# Patient Record
Sex: Female | Born: 1975 | Race: Black or African American | Hispanic: No | Marital: Single | Smoking: Former smoker
Health system: Southern US, Community
[De-identification: ages and names within clinical notes are randomized; demographics above are authoritative.]

## PROBLEM LIST (undated history)

## (undated) ENCOUNTER — Emergency Department (HOSPITAL_COMMUNITY): Admission: EM | Payer: Medicaid Other | Source: Home / Self Care

## (undated) DIAGNOSIS — I10 Essential (primary) hypertension: Secondary | ICD-10-CM

---

## 2009-10-08 HISTORY — PX: ENDOMETRIAL ABLATION: SHX621

## 2019-08-13 ENCOUNTER — Other Ambulatory Visit: Payer: Self-pay | Admitting: Nurse Practitioner

## 2019-08-13 DIAGNOSIS — N6320 Unspecified lump in the left breast, unspecified quadrant: Secondary | ICD-10-CM

## 2019-08-13 DIAGNOSIS — N6310 Unspecified lump in the right breast, unspecified quadrant: Secondary | ICD-10-CM

## 2019-08-19 ENCOUNTER — Other Ambulatory Visit: Payer: Self-pay | Admitting: Nurse Practitioner

## 2019-08-20 ENCOUNTER — Other Ambulatory Visit: Payer: Self-pay

## 2019-09-09 ENCOUNTER — Ambulatory Visit
Admission: RE | Admit: 2019-09-09 | Discharge: 2019-09-09 | Disposition: A | Discharge status: Home / Self Care | Payer: Medicaid Other | Source: Ambulatory Visit | Attending: Nurse Practitioner | Admitting: Nurse Practitioner

## 2019-09-09 ENCOUNTER — Other Ambulatory Visit: Payer: Medicaid Other

## 2019-09-09 ENCOUNTER — Other Ambulatory Visit: Payer: Self-pay

## 2019-09-09 ENCOUNTER — Other Ambulatory Visit: Payer: Self-pay | Admitting: Nurse Practitioner

## 2019-09-09 DIAGNOSIS — N6310 Unspecified lump in the right breast, unspecified quadrant: Secondary | ICD-10-CM

## 2019-09-09 DIAGNOSIS — N6320 Unspecified lump in the left breast, unspecified quadrant: Secondary | ICD-10-CM

## 2019-09-09 IMAGING — US US BREAST*R* LIMITED INC AXILLA
1 series · 1 of 1 positions shown · non-contrast
Comparison: Initial mammogram earlier same day

CLINICAL DATA: Patient returns for ultrasound evaluation of the
palpable area of concern within the right breast 5 o'clock position.

EXAM:
ULTRASOUND OF THE RIGHT BREAST

[Series 1: us breast*right* limited inc axilla · 0.07mm/px · 1 of 1 slices shown]
[im 1/1]
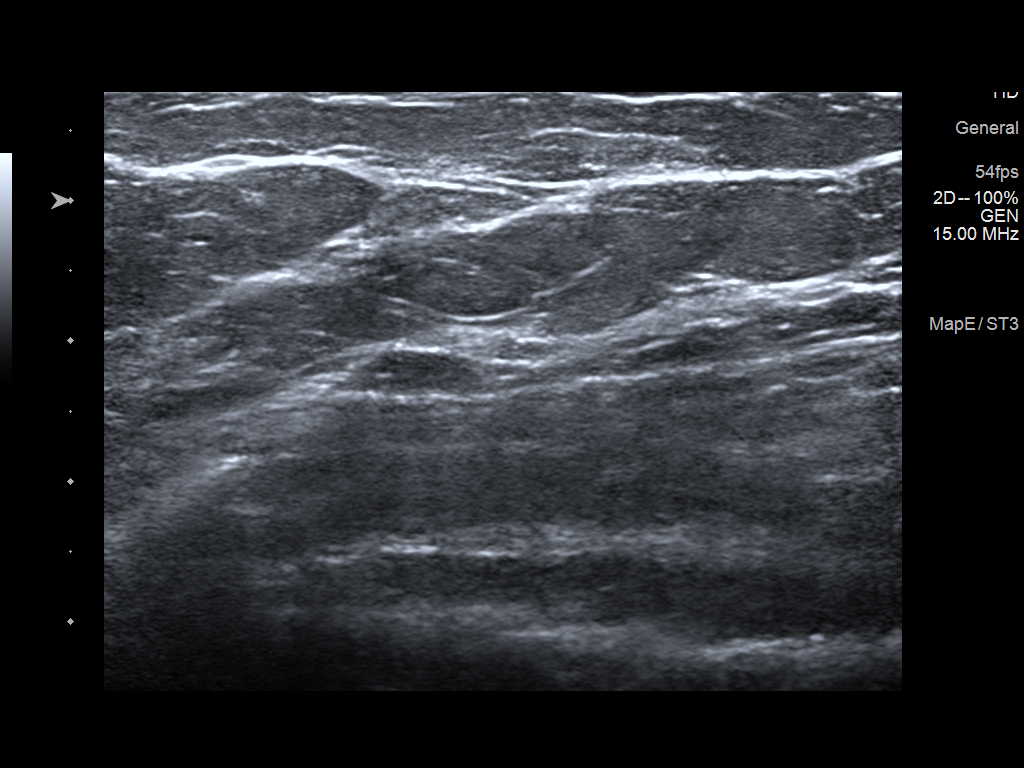

[1 of 1 positions shown; findings below may reference images not displayed]

FINDINGS: Targeted ultrasound is performed, showing normal tissue within the
right breast 5 o'clock position 8 cm from nipple at the reported
site of palpable concern. No suspicious abnormality identified.
IMPRESSION: No sonographic evidence for malignancy at the site of palpable
concern right breast.

RECOMMENDATION:
Screening mammogram in one year.(Code:[WP])

I have discussed the findings and recommendations with the patient.
If applicable, a reminder letter will be sent to the patient
regarding the next appointment.

BI-RADS CATEGORY  1: Negative.

## 2019-09-09 IMAGING — MG DIGITAL DIAGNOSTIC BILAT W/ TOMO W/ CAD
6 of 10 series · 6 of 30 positions shown · non-contrast
Comparison: None

CLINICAL DATA: Patient presents for evaluation of palpable
abnormality within the 5 o'clock position right breast.

EXAM:
DIGITAL DIAGNOSTIC BILATERAL MAMMOGRAM WITH CAD AND TOMO

[R ML synth-2D]
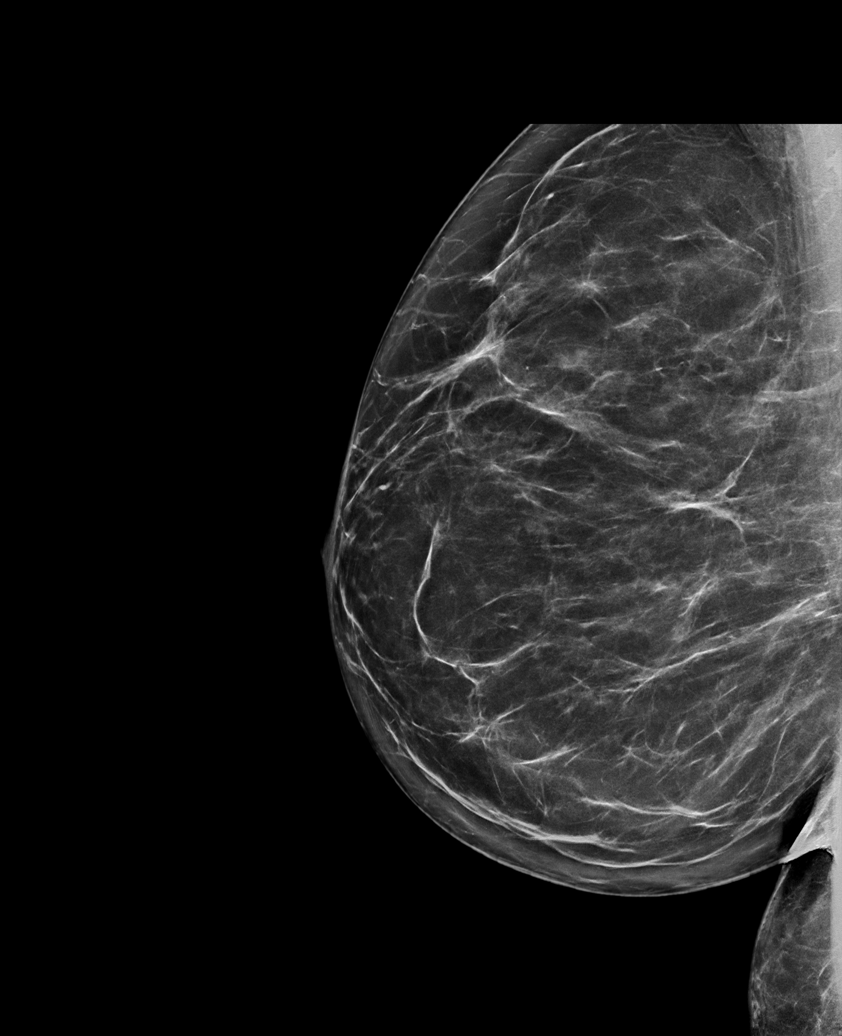

[L MLO synth-2D]
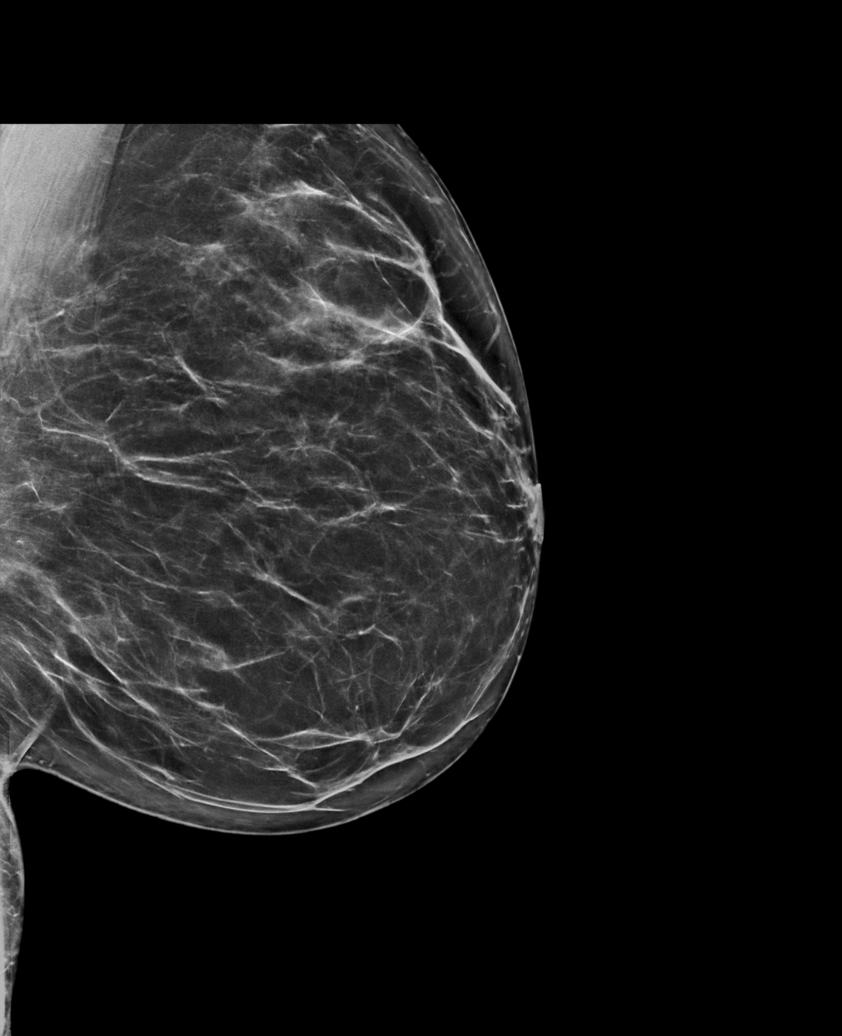

[L CC synth-2D]
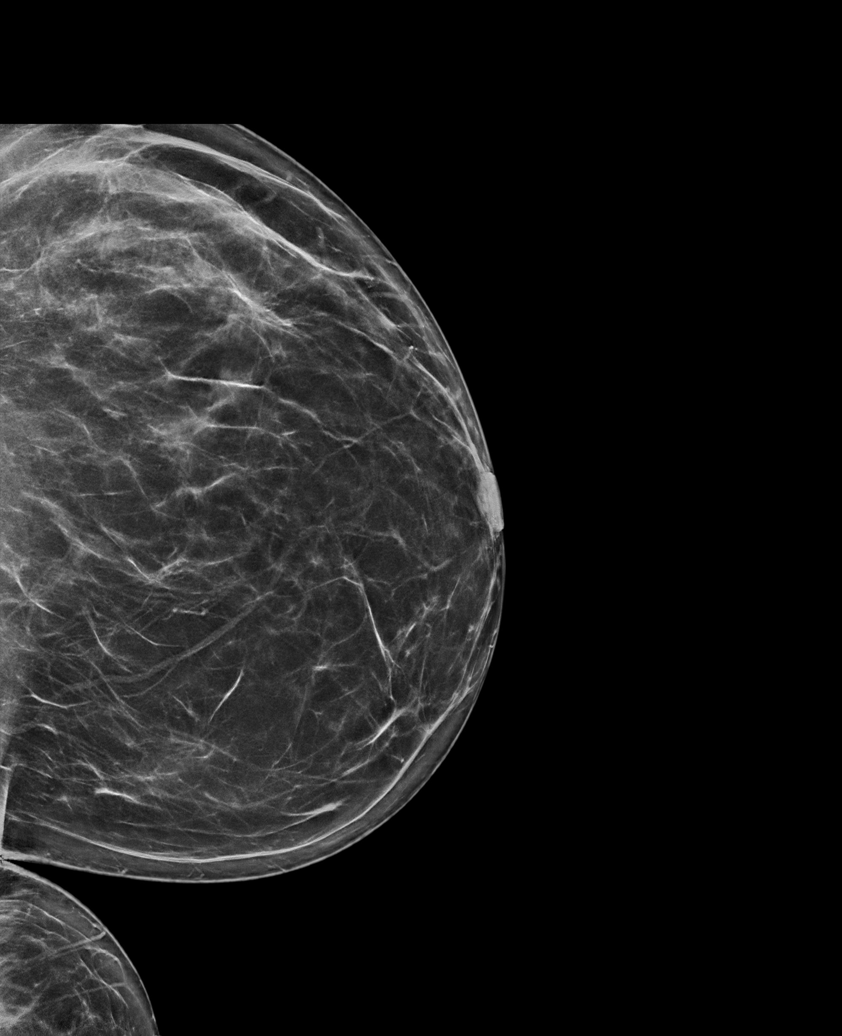

[R MLO synth-2D]
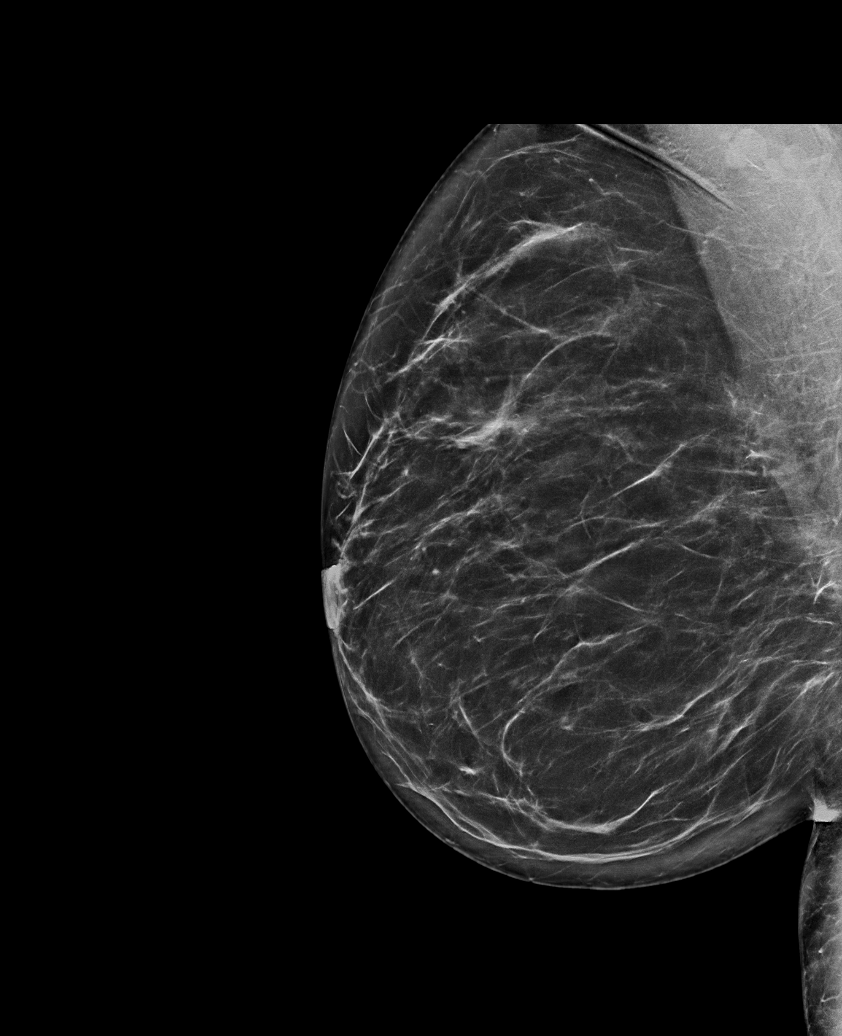

[R CC synth-2D]
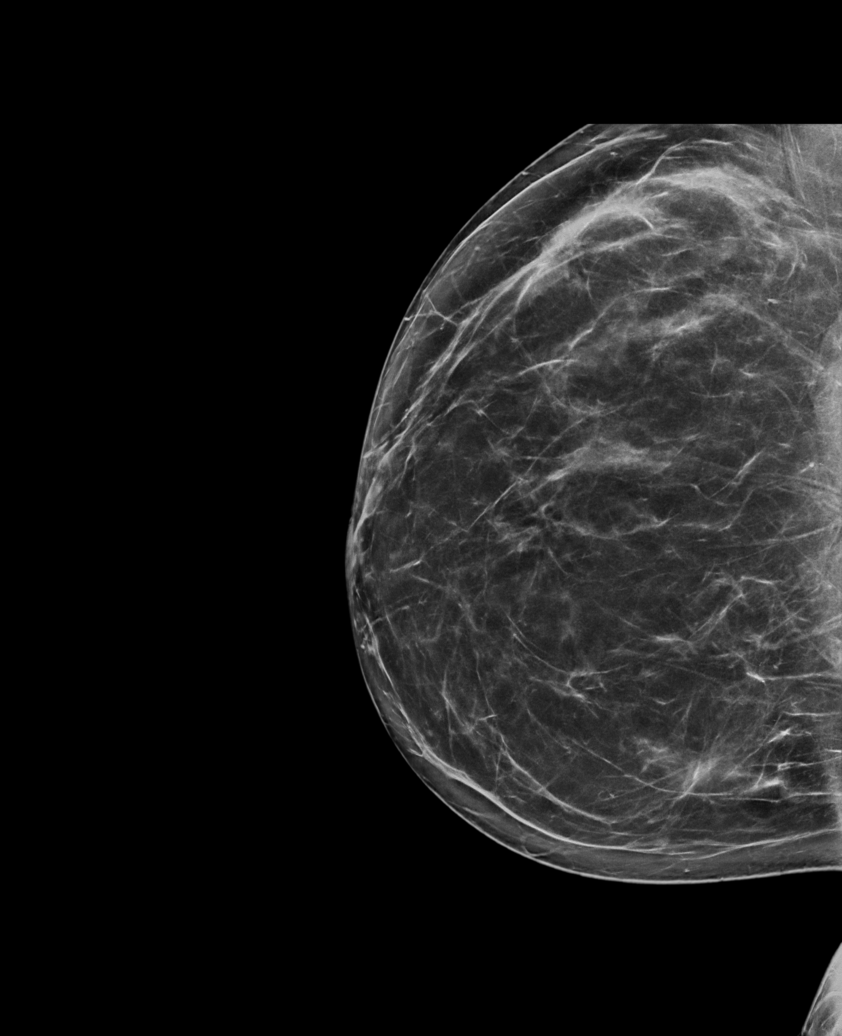

[L MLO tomo · tomo slice 43/85.0]
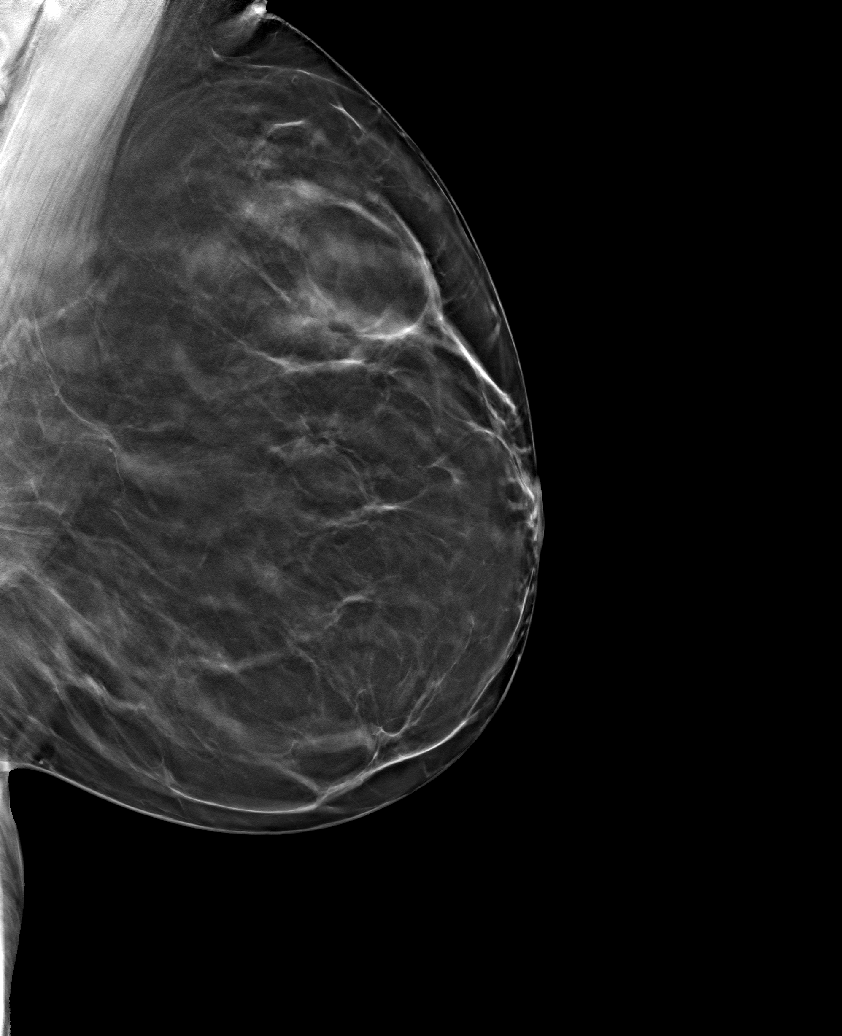

[6 of 30 positions shown; findings below may reference images not displayed]

ACR Breast Density Category b: There are scattered areas of
fibroglandular density.
FINDINGS: No concerning masses, calcifications or distortion identified within
either breast.

Mammographic images were processed with CAD.
IMPRESSION: No mammographic evidence for malignancy.

Patient needs dedicated ultrasound of the area of palpable concern
within the right breast.

RECOMMENDATION:
Dedicated ultrasound of the focal palpable abnormality within the 5
o'clock position right breast.

I have discussed the findings and recommendations with the patient.
If applicable, a reminder letter will be sent to the patient
regarding the next appointment.

BI-RADS CATEGORY  0: Incomplete. Need additional imaging evaluation
and/or prior mammograms for comparison.

## 2020-12-05 ENCOUNTER — Emergency Department (HOSPITAL_COMMUNITY)
Admission: EM | Admit: 2020-12-05 | Discharge: 2020-12-05 | Disposition: A | Discharge status: Home / Self Care | Payer: Medicaid Other | Attending: Emergency Medicine | Admitting: Emergency Medicine

## 2020-12-05 ENCOUNTER — Encounter (HOSPITAL_COMMUNITY): Payer: Self-pay | Admitting: Emergency Medicine

## 2020-12-05 ENCOUNTER — Other Ambulatory Visit: Payer: Self-pay

## 2020-12-05 ENCOUNTER — Emergency Department (HOSPITAL_COMMUNITY): Payer: Medicaid Other

## 2020-12-05 DIAGNOSIS — B373 Candidiasis of vulva and vagina: Secondary | ICD-10-CM | POA: Insufficient documentation

## 2020-12-05 DIAGNOSIS — N8003 Adenomyosis of the uterus: Secondary | ICD-10-CM

## 2020-12-05 DIAGNOSIS — B3731 Acute candidiasis of vulva and vagina: Secondary | ICD-10-CM

## 2020-12-05 DIAGNOSIS — R102 Pelvic and perineal pain: Secondary | ICD-10-CM | POA: Diagnosis not present

## 2020-12-05 DIAGNOSIS — N800 Endometriosis of uterus: Secondary | ICD-10-CM | POA: Insufficient documentation

## 2020-12-05 DIAGNOSIS — R112 Nausea with vomiting, unspecified: Secondary | ICD-10-CM

## 2020-12-05 DIAGNOSIS — Z79899 Other long term (current) drug therapy: Secondary | ICD-10-CM | POA: Insufficient documentation

## 2020-12-05 DIAGNOSIS — R1030 Lower abdominal pain, unspecified: Secondary | ICD-10-CM | POA: Diagnosis present

## 2020-12-05 DIAGNOSIS — I10 Essential (primary) hypertension: Secondary | ICD-10-CM | POA: Diagnosis not present

## 2020-12-05 HISTORY — DX: Essential (primary) hypertension: I10

## 2020-12-05 LAB — COMPREHENSIVE METABOLIC PANEL
ALT: 21 U/L (ref 0–44)
AST: 26 U/L (ref 15–41)
Albumin: 4.1 g/dL (ref 3.5–5.0)
Alkaline Phosphatase: 54 U/L (ref 38–126)
Anion gap: 12 (ref 5–15)
BUN: 10 mg/dL (ref 6–20)
CO2: 19 mmol/L — ABNORMAL LOW (ref 22–32)
Calcium: 8.9 mg/dL (ref 8.9–10.3)
Chloride: 105 mmol/L (ref 98–111)
Creatinine, Ser: 0.67 mg/dL (ref 0.44–1.00)
GFR, Estimated: >60 mL/min (ref >60)
Glucose, Bld: 103 mg/dL — ABNORMAL HIGH (ref 70–99)
Potassium: 3.8 mmol/L (ref 3.5–5.1)
Sodium: 136 mmol/L (ref 135–145)
Total Bilirubin: 0.7 mg/dL (ref 0.3–1.2)
Total Protein: 7.4 g/dL (ref 6.5–8.1)

## 2020-12-05 LAB — WET PREP, GENITAL
Clue Cells Wet Prep HPF POC: NONE SEEN
Sperm: NONE SEEN
Trich, Wet Prep: NONE SEEN

## 2020-12-05 LAB — URINALYSIS, ROUTINE W REFLEX MICROSCOPIC
Bacteria, UA: NONE SEEN
Bilirubin Urine: NEGATIVE
Glucose, UA: NEGATIVE mg/dL
Ketones, ur: 20 mg/dL — AB
Leukocytes,Ua: NEGATIVE
Nitrite: NEGATIVE
Protein, ur: NEGATIVE mg/dL
Specific Gravity, Urine: 1.017 (ref 1.005–1.030)
pH: 8 (ref 5.0–8.0)

## 2020-12-05 LAB — CBC
HCT: 41.1 % (ref 36.0–46.0)
Hemoglobin: 13 g/dL (ref 12.0–15.0)
MCH: 31.9 pg (ref 26.0–34.0)
MCHC: 31.6 g/dL (ref 30.0–36.0)
MCV: 100.7 fL — ABNORMAL HIGH (ref 80.0–100.0)
Platelets: 247 10*3/uL (ref 150–400)
RBC: 4.08 MIL/uL (ref 3.87–5.11)
RDW: 11.6 % (ref 11.5–15.5)
WBC: 4.6 10*3/uL (ref 4.0–10.5)
nRBC: 0 % (ref 0.0–0.2)

## 2020-12-05 LAB — I-STAT BETA HCG BLOOD, ED (MC, WL, AP ONLY): I-stat hCG, quantitative: <5 m[IU]/mL (ref <5)

## 2020-12-05 LAB — LIPASE, BLOOD: Lipase: 42 U/L (ref 11–51)

## 2020-12-05 IMAGING — US US PELVIS COMPLETE TRANSABD/TRANSVAG W DUPLEX
1 series · 13 of 25 positions shown · non-contrast
Comparison: None.

CLINICAL DATA: Pelvic pain with cramping and vomiting. Remote
endometrial ablation. Question ovarian torsion.

EXAM:
TRANSABDOMINAL AND TRANSVAGINAL ULTRASOUND OF PELVIS
DOPPLER ULTRASOUND OF OVARIES
TECHNIQUE: Both transabdominal and transvaginal ultrasound examinations of the
pelvis were performed. Transabdominal technique was performed for
global imaging of the pelvis including uterus, ovaries, adnexal
regions, and pelvic cul-de-sac.
It was necessary to proceed with endovaginal exam following the
transabdominal exam to visualize the endometrium and ovaries to
better advantage. Color and duplex Doppler ultrasound was utilized
to evaluate blood flow to the ovaries.

[Series 1: us pelvic complete w transvaginal and torsion righ · 13 of 72 slices shown]
[im 1/72]
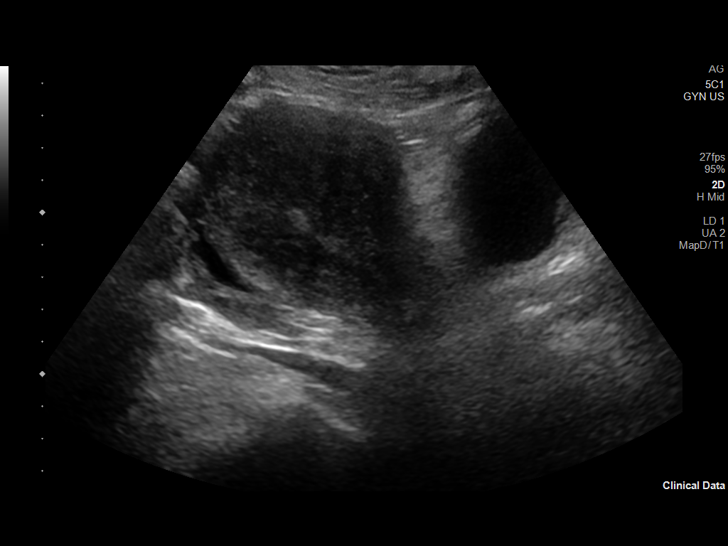
[im 6/72]
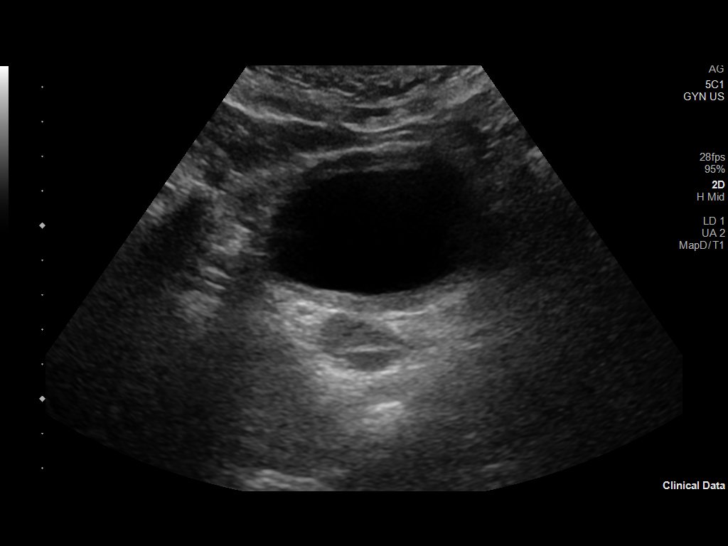
[im 12/72]
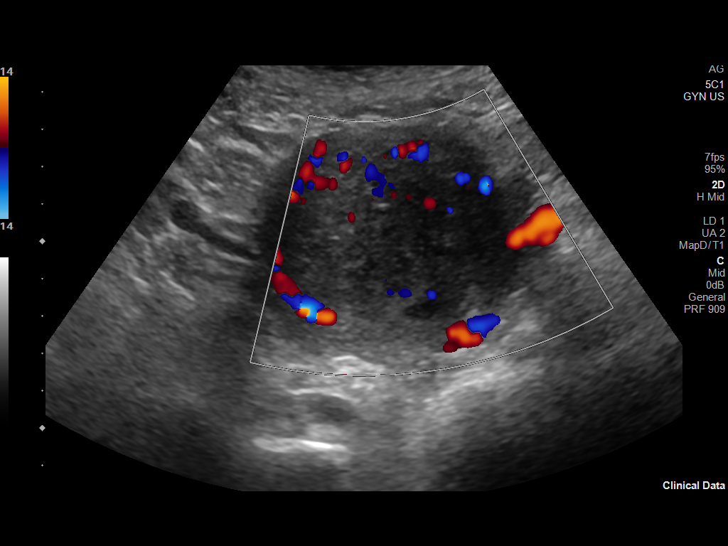
[im 18/72]
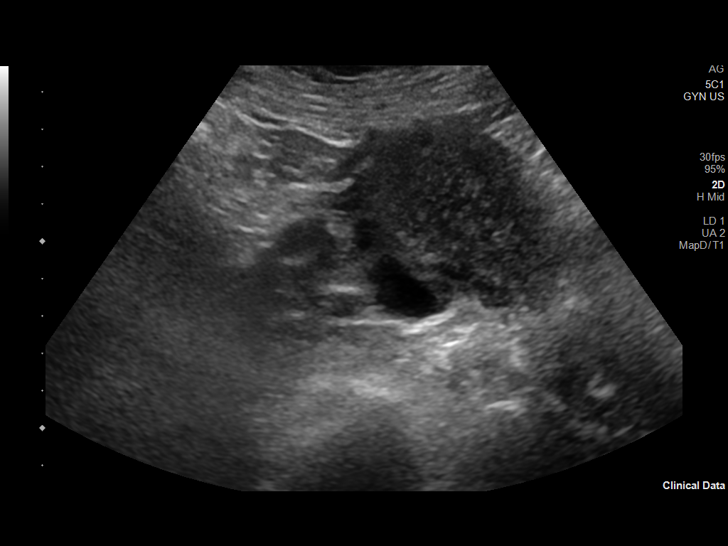
[im 24/72]
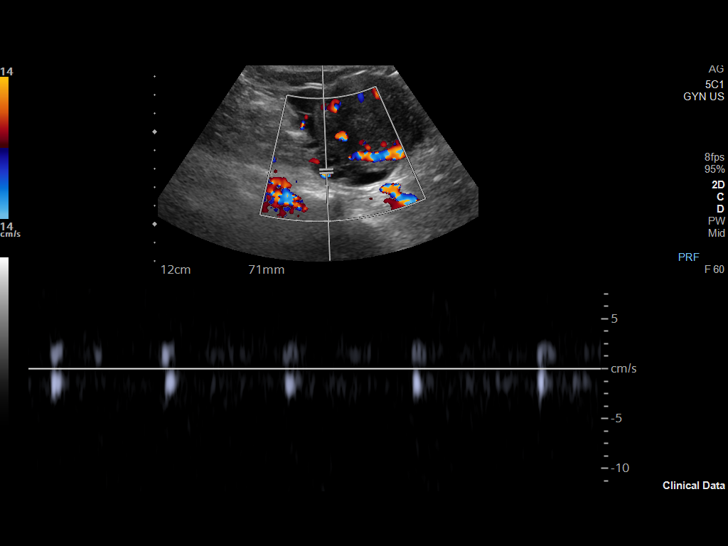
[im 30/72]
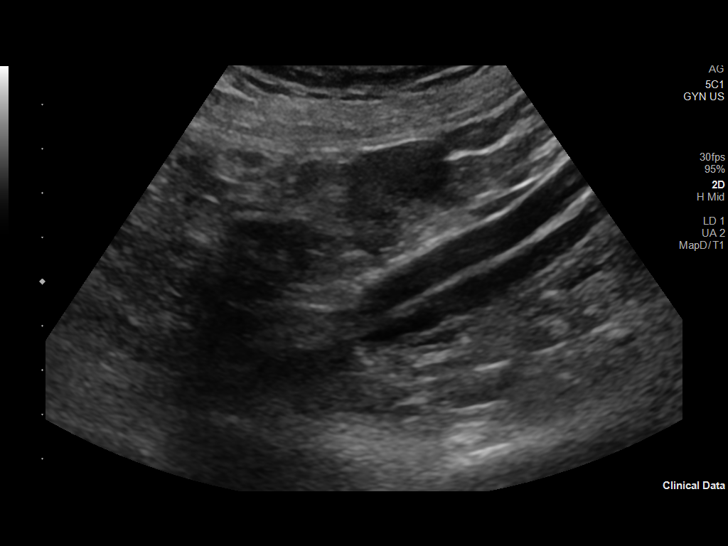
[im 36/72]
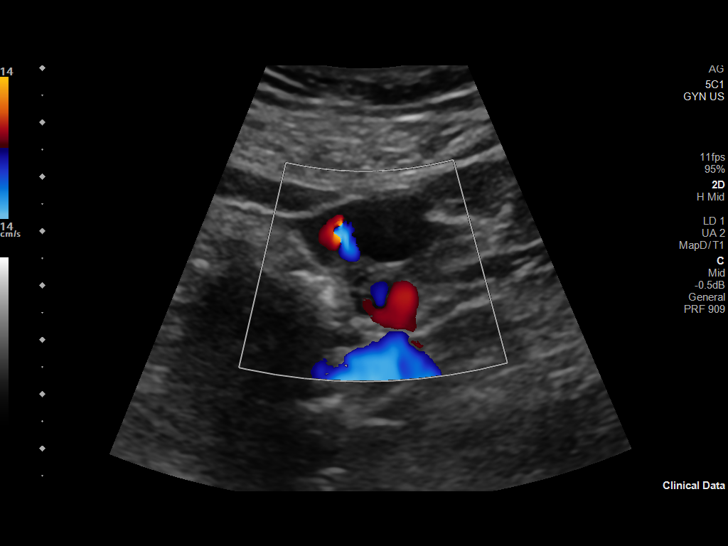
[im 42/72]
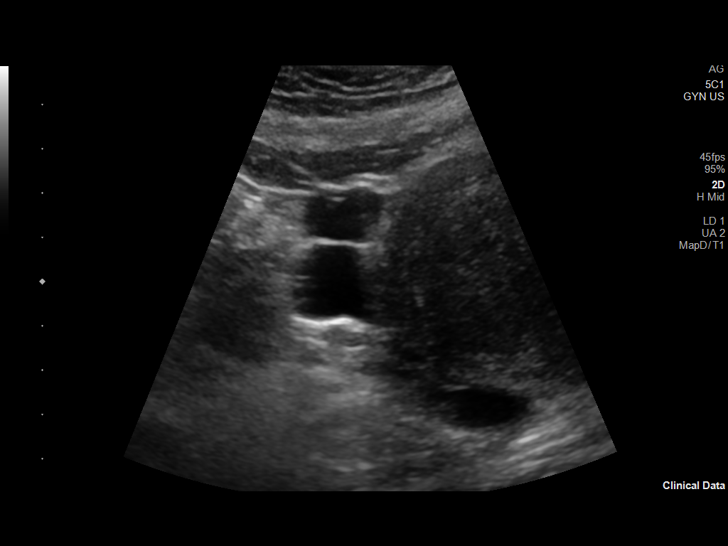
[im 48/72]
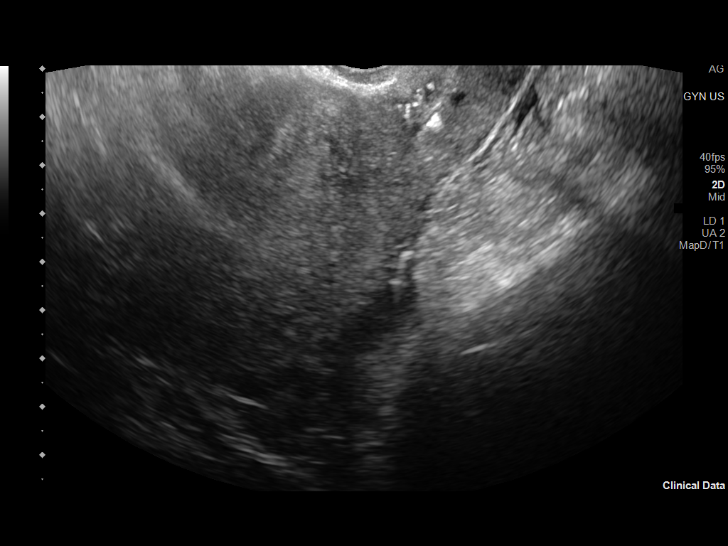
[im 54/72]
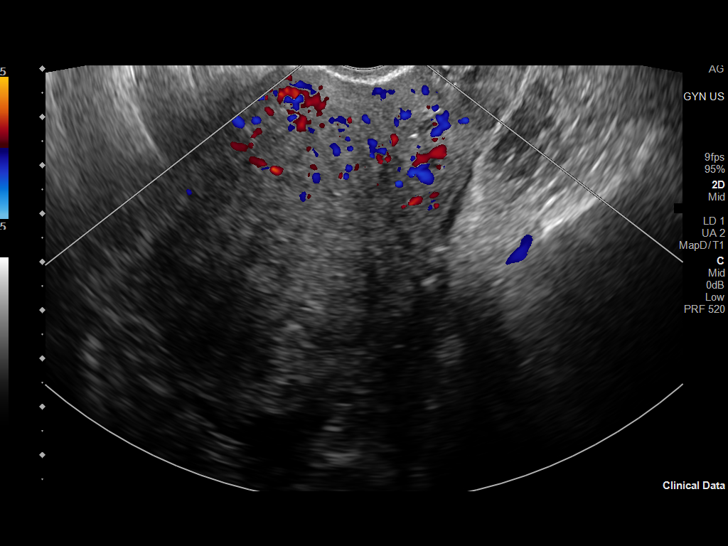
[im 60/72]
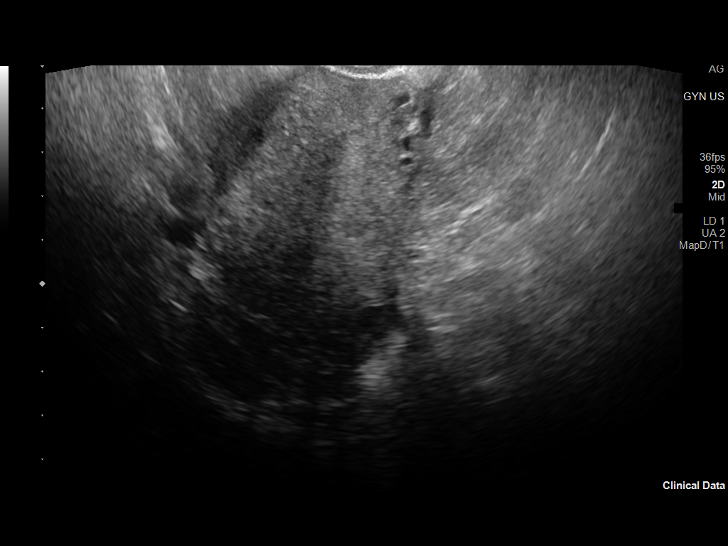
[im 66/72]
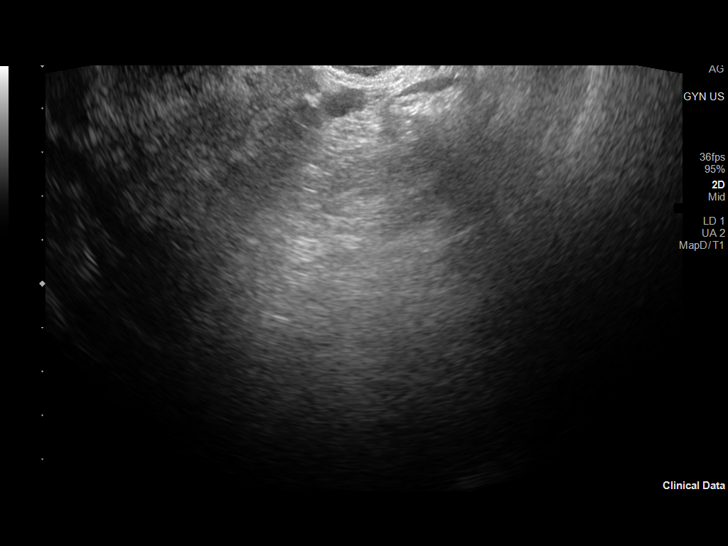
[im 72/72]
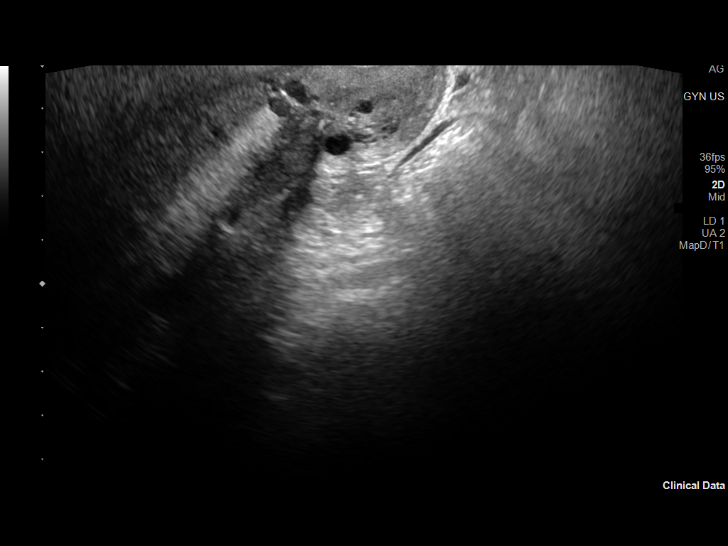

[13 of 25 positions shown; findings below may reference images not displayed]

FINDINGS: Uterus

Measurements: 10.9 x 6.9 x 6.5 cm = volume: 253 mL. The myometrium
is diffusely heterogeneous without discrete fibroids. Appearance
suggests possible adenomyosis. Small cervical nabothian cysts are
noted.

Endometrium

Thickness: 6 mm. The endometrium appears somewhat ill-defined,
although no focal abnormality is seen.

Right ovary

Measurements: 4.7 x 1.9 x 4.0 cm = volume: 17.8 mL. Normal
appearance without adnexal mass. Normal blood flow with color
Doppler.

Left ovary

Measurements: 2.8 x 2.4 x 2.1 cm = volume: 7.4 mL. Normal appearance
without adnexal mass. Normal blood flow with color Doppler.

Pulsed Doppler evaluation of both ovaries demonstrates normal
low-resistance arterial and venous waveforms.

Other findings

No abnormal free fluid.
IMPRESSION: 1. No evidence of ovarian torsion or adnexal mass.
2. Heterogeneity of the myometrium suspicious for adenomyosis. No
focal lesion or endometrial thickening.

## 2020-12-05 MED ORDER — ONDANSETRON 4 MG PO TBDP: ORAL_TABLET | ORAL | 0 refills | Status: DC

## 2020-12-05 MED ORDER — SODIUM CHLORIDE 0.9 % IV BOLUS
1000.0000 mL | Freq: Once | INTRAVENOUS | Status: AC
  Administered 2020-12-05: 1000 mL via INTRAVENOUS

## 2020-12-05 MED ORDER — ONDANSETRON HCL 4 MG/2ML IJ SOLN
4.0000 mg | Freq: Once | INTRAMUSCULAR | Status: AC
  Administered 2020-12-05: 4 mg via INTRAVENOUS
  Filled 2020-12-05: qty 2

## 2020-12-05 MED ORDER — NAPROXEN 500 MG PO TABS: 500.0000 mg | ORAL_TABLET | Freq: Two times a day (BID) | ORAL | 0 refills | Status: DC

## 2020-12-05 MED ORDER — MORPHINE SULFATE (PF) 4 MG/ML IV SOLN
4.0000 mg | Freq: Once | INTRAVENOUS | Status: AC
  Administered 2020-12-05: 4 mg via INTRAVENOUS
  Filled 2020-12-05: qty 1

## 2020-12-05 MED ORDER — FLUCONAZOLE 150 MG PO TABS
150.0000 mg | ORAL_TABLET | Freq: Once | ORAL | Status: AC
  Administered 2020-12-05: 150 mg via ORAL
  Filled 2020-12-05: qty 1

## 2020-12-05 MED ORDER — KETOROLAC TROMETHAMINE 30 MG/ML IJ SOLN
30.0000 mg | Freq: Once | INTRAMUSCULAR | Status: AC
  Administered 2020-12-05: 30 mg via INTRAVENOUS
  Filled 2020-12-05: qty 1

## 2020-12-05 NOTE — Discharge Instructions (Signed)
Your evaluation today is overall been reassuring.  Your wet prep did show a yeast infection which you were treated for today.  Suspect the majority of your pain is coming from cramping from your menstrual cycle.  Your ultrasound today shows adenomyosis which can lead to very painful menstrual cycles.  Use naproxen twice daily and Zofran as needed for nausea and vomiting.  Please call to schedule follow-up with OB/GYN.  Return for new or worsening symptoms.

## 2020-12-05 NOTE — ED Notes (Signed)
Pt not in the room: in [place]

## 2020-12-05 NOTE — ED Provider Notes (Signed)
[facility] Provider Note   CSN: [id number] Arrival date & time: 12/05/20  0932     History Chief Complaint  Patient presents with  . Abdominal Pain  . Emesis    Jenna Heath is a 45 y.o. female.  Jenna Heath is a 45 y.o. female with a history of hypertension, who presents to the emergency department for evaluation of lower abdominal pain and low back pain.  She reports symptoms started this morning as she woke up and are severe, constant and not improving.  She describes the pain as cramping in nature.  Pain does not localize to 1 side.  She reports associated nausea with a few episodes of nonbloody emesis this morning.  Denies any diarrhea, constipation or blood in stool.  Denies any dysuria, urinary frequency, hematuria or flank pain.  No vaginal discharge or vaginal bleeding, but patient does report that her menstrual cycle is supposed to start today or tomorrow.  She reports she has had a history of severe cramping with her menstrual cycles and sometimes becomes nauseous and has vomiting.  She reports that her cycle had not started yet today so she was concerned this could be something different although reports that it does feel similar.  She recalls in the past being told she had uterine fibroids or ovarian cyst but cannot remember exactly.  Recently moved here from [place] and does not have an OB/GYN.  She reports in the past she has been prescribed naproxen.  Has not taken any medication today, because of vomiting, has not been able to keep anything down.  Unable to take her blood pressure medication either.  No other aggravating or alleviating factors.  No previous abdominal surgeries.        Past Medical History:  Diagnosis Date  . Hypertension     There are no problems to display for this patient.      OB History   No obstetric history on file.     No family history on file.     Home Medications Prior to Admission  medications   Medication Sig Start Date End Date Taking? Authorizing Provider  hydrochlorothiazide (MICROZIDE) 12.5 MG capsule Take 12.5 mg by mouth daily.   Yes [provider]  naproxen (NAPROSYN) 500 MG tablet Take 1 tablet (500 mg total) by mouth 2 (two) times daily. 12/05/20  Yes Jacqlyn Larsen, PA-C  ondansetron (ZOFRAN ODT) 4 MG disintegrating tablet 4mg  ODT q4 hours prn nausea/vomit 12/05/20  Yes Jacqlyn Larsen, PA-C    Allergies    Patient has no known allergies.  Review of Systems   Review of Systems  Constitutional: Negative for chills and fever.  HENT: Negative.   Respiratory: Negative for cough and shortness of breath.   Cardiovascular: Negative for chest pain.  Gastrointestinal: Positive for abdominal pain, nausea and vomiting. Negative for blood in stool, constipation and diarrhea.  Genitourinary: Positive for pelvic pain. Negative for dysuria, flank pain, frequency, vaginal bleeding and vaginal discharge.  Musculoskeletal: Positive for back pain. Negative for arthralgias, myalgias and neck stiffness.  Skin: Negative for color change and rash.  Neurological: Negative for dizziness, syncope and light-headedness.  All other systems reviewed and are negative.   Physical Exam Updated Vital Signs BP (!) 170/104   Pulse 95   Temp 98.9 F (37.2 C) (Oral)   Resp (!) 22   Ht 5\' 9"  (1.753 m)   Wt 88.5 kg   SpO2 98%   BMI  28.80 kg/m   Physical Exam Vitals and nursing note reviewed. Exam conducted with a chaperone present.  Constitutional:      General: She is not in acute distress.    Appearance: She is well-developed and well-nourished. She is not diaphoretic.     Comments: Patient appears uncomfortable, but is in no acute distress  HENT:     Head: Normocephalic and atraumatic.     Mouth/Throat:     Mouth: Oropharynx is clear and moist. Mucous membranes are moist.     Pharynx: Oropharynx is clear.  Eyes:     General:        Right eye: No discharge.         Left eye: No discharge.     Extraocular Movements: EOM normal.     Pupils: Pupils are equal, round, and reactive to light.  Cardiovascular:     Rate and Rhythm: Normal rate and regular rhythm.     Pulses: Intact distal pulses.     Heart sounds: Normal heart sounds.  Pulmonary:     Effort: Pulmonary effort is normal. No respiratory distress.     Breath sounds: Normal breath sounds. No wheezing or rales.     Comments: Respirations equal and unlabored, patient able to speak in full sentences, lungs clear to auscultation bilaterally  Abdominal:     General: Bowel sounds are normal. There is no distension.     Palpations: Abdomen is soft. There is no mass.     Tenderness: There is abdominal tenderness in the right lower quadrant, suprapubic area and left lower quadrant. There is no guarding.     Comments: Abdomen is soft, nondistended, bowel sounds present throughout, there is diffuse tenderness across the lower abdomen, but no guarding or rebound tenderness.  No CVA tenderness  Genitourinary:    Comments: Chaperone present during pelvic exam. No external genital lesions noted. Speculum exam reveals mild amount of vaginal bleeding starting from the cervix, no significant discharge present, mild erythema of the vaginal walls, cervix appears normal.  On bimanual exam patient has some mild discomfort throughout but no cervical motion tenderness and no focal adnexal tenderness or masses.  Patient does have some mild uterine tenderness on palpation, no palpable masses. Musculoskeletal:        General: No deformity or edema.     Cervical back: Neck supple.  Skin:    General: Skin is warm and dry.     Capillary Refill: Capillary refill takes less than 2 seconds.  Neurological:     Mental Status: She is alert.     Coordination: Coordination normal.     Comments: Speech is clear, able to follow commands Moves extremities without ataxia, coordination intact  Psychiatric:        Mood and Affect:  Mood normal.        Behavior: Behavior normal.     ED Results / Procedures / Treatments   Labs (all labs ordered are listed, but only abnormal results are displayed) Labs Reviewed  WET PREP, GENITAL - Abnormal; Notable for the following components:      Result Value   Yeast Wet Prep HPF POC PRESENT (*)    WBC, Wet Prep HPF POC FEW (*)    All other components within normal limits  COMPREHENSIVE METABOLIC PANEL - Abnormal; Notable for the following components:   CO2 19 (*)    Glucose, Bld 103 (*)    All other components within normal limits  CBC - Abnormal; Notable for the  following components:   MCV 100.7 (*)    All other components within normal limits  URINALYSIS, ROUTINE W REFLEX MICROSCOPIC - Abnormal; Notable for the following components:   Hgb urine dipstick SMALL (*)    Ketones, ur 20 (*)    All other components within normal limits  LIPASE, BLOOD  I-STAT BETA HCG BLOOD, ED ([facility], [facility], [facility] ONLY)  GC/CHLAMYDIA PROBE AMP ([place]) NOT AT [facility]    EKG EKG Interpretation  Date/Time:  Monday December 05 2020 10:00:55 EST Ventricular Rate:  92 PR Interval:    QRS Duration: 87 QT Interval:  371 QTC Calculation: 459 R Axis:   52 Text Interpretation: Sinus rhythm no ischemic appearance. normal Confirmed by Charlesetta Shanks [phone]) on 12/05/2020 12:13:59 PM   Radiology US PELVIC COMPLETE W TRANSVAGINAL AND TORSION R/O  Result Date: 12/05/2020 CLINICAL DATA:  Pelvic pain with cramping and vomiting. Remote endometrial ablation. Question ovarian torsion. EXAM: TRANSABDOMINAL AND TRANSVAGINAL ULTRASOUND OF PELVIS DOPPLER ULTRASOUND OF OVARIES TECHNIQUE: Both transabdominal and transvaginal ultrasound examinations of the pelvis were performed. Transabdominal technique was performed for global imaging of the pelvis including uterus, ovaries, adnexal regions, and pelvic cul-de-sac. It was necessary to proceed with endovaginal exam following the transabdominal exam to visualize the  endometrium and ovaries to better advantage. Color and duplex Doppler ultrasound was utilized to evaluate blood flow to the ovaries. COMPARISON:  None. FINDINGS: Uterus Measurements: 10.9 x 6.9 x 6.5 cm = volume: 253 mL. The myometrium is diffusely heterogeneous without discrete fibroids. Appearance suggests possible adenomyosis. Small cervical nabothian cysts are noted. Endometrium Thickness: 6 mm. The endometrium appears somewhat ill-defined, although no focal abnormality is seen. Right ovary Measurements: 4.7 x 1.9 x 4.0 cm = volume: 17.8 mL. Normal appearance without adnexal mass. Normal blood flow with color Doppler. Left ovary Measurements: 2.8 x 2.4 x 2.1 cm = volume: 7.4 mL. Normal appearance without adnexal mass. Normal blood flow with color Doppler. Pulsed Doppler evaluation of both ovaries demonstrates normal low-resistance arterial and venous waveforms. Other findings No abnormal free fluid. IMPRESSION: 1. No evidence of ovarian torsion or adnexal mass. 2. Heterogeneity of the myometrium suspicious for adenomyosis. No focal lesion or endometrial thickening. Electronically Signed   By: Richardean Sale M.D.   On: 12/05/2020 14:27    Procedures Procedures   Medications Ordered in ED Medications  sodium chloride 0.9 % bolus 1,000 mL (1,000 mLs Intravenous New Bag/Given 12/05/20 1138)  ondansetron (ZOFRAN) injection 4 mg (4 mg Intravenous Given 12/05/20 1142)  morphine 4 MG/ML injection 4 mg (4 mg Intravenous Given 12/05/20 1140)  fluconazole (DIFLUCAN) tablet 150 mg (150 mg Oral Given 12/05/20 1225)  ketorolac (TORADOL) 30 MG/ML injection 30 mg (30 mg Intravenous Given 12/05/20 1520)    ED Course  I have reviewed the triage vital signs and the nursing notes.  Pertinent labs & imaging results that were available during my care of the patient were reviewed by me and considered in my medical decision making (see chart for details).    MDM Rules/Calculators/A&P                         Patient  presents to the ED with complaints of abdominal and low back pain.  Associated nausea and vomiting this morning patient nontoxic appearing, in no apparent distress, vitals WNL aside from hypertension, patient unable to take her blood pressure medicine this morning due to vomiting. On exam patient tender to palpation across the lower  abdomen, no peritoneal signs.  Had similar symptoms in the past related to her menstrual cycle, on pelvic exam patient does now have some vaginal bleeding which she had not noted previously, she has cramping during exam, no other significant findings on pelvic exam possible history of fibroids or ovarian cyst.  Will evaluate with labs and pelvic ultrasound. Analgesics, anti-emetics, and fluids administered.   ER work-up reviewed:  CBC: No leukocytosis, normal hemoglobin CMP: No significant electrolyte derangements, normal renal and liver function Lipase: WNL UA: No hematuria or signs of infection Preg test: Negative  Wet prep with yeast present as well as a few clue cells, will treat with diflucan.  Pelvic US shows likely adenomyosis which likely explains patients painful menstrual cycles. No evidence of torsion, ovarian cysts or uterine fibroids will have pt follow up with OB/GYN  On repeat abdominal exam patient remains without peritoneal signs, doubt cholecystitis, pancreatitis, diverticulitis, appendicitis, bowel obstruction/perforation, PID, or ectopic pregnancy. Patient tolerating PO in the emergency department and pain significantly improved. Will discharge home with supportive measures. I discussed results, treatment plan, need for PCP & GYN follow-up, and return precautions with the patient. Provided opportunity for questions, patient confirmed understanding and is in agreement with plan.    Final Clinical Impression(s) / ED Diagnoses Final diagnoses:  Pelvic pain  Non-intractable vomiting with nausea, unspecified vomiting type  Adenomyosis  Vaginal yeast  infection    Rx / DC Orders ED Discharge Orders         Ordered    naproxen (NAPROSYN) 500 MG tablet  2 times daily        12/05/20 1525    ondansetron (ZOFRAN ODT) 4 MG disintegrating tablet        12/05/20 1525           Benedetto Goad [place], [place] 12/07/20 1233    Charlesetta Shanks, MD 12/07/20 1702

## 2020-12-05 NOTE — ED Triage Notes (Signed)
Patient coming from home. Patient complaining of abdominal pain and back pain that woke her up this morning. A&Ox4.

## 2020-12-05 NOTE — ED Notes (Signed)
Pelvic cart at bedside. 

## 2020-12-06 LAB — GC/CHLAMYDIA PROBE AMP (~~LOC~~) NOT AT ARMC
Chlamydia: NEGATIVE
Comment: NEGATIVE
Comment: NORMAL
Neisseria Gonorrhea: NEGATIVE

## 2021-05-28 ENCOUNTER — Encounter (HOSPITAL_COMMUNITY): Payer: Self-pay | Admitting: Student

## 2021-05-28 ENCOUNTER — Other Ambulatory Visit: Payer: Self-pay

## 2021-05-28 ENCOUNTER — Emergency Department (HOSPITAL_COMMUNITY)
Admission: EM | Admit: 2021-05-28 | Discharge: 2021-05-28 | Disposition: A | Discharge status: Home / Self Care | Payer: Medicaid Other | Attending: Emergency Medicine | Admitting: Emergency Medicine

## 2021-05-28 DIAGNOSIS — I10 Essential (primary) hypertension: Secondary | ICD-10-CM | POA: Diagnosis not present

## 2021-05-28 DIAGNOSIS — R102 Pelvic and perineal pain: Secondary | ICD-10-CM | POA: Diagnosis not present

## 2021-05-28 DIAGNOSIS — R112 Nausea with vomiting, unspecified: Secondary | ICD-10-CM | POA: Diagnosis not present

## 2021-05-28 DIAGNOSIS — Z79899 Other long term (current) drug therapy: Secondary | ICD-10-CM | POA: Diagnosis not present

## 2021-05-28 LAB — URINALYSIS, ROUTINE W REFLEX MICROSCOPIC
Bacteria, UA: NONE SEEN
Bilirubin Urine: NEGATIVE
Glucose, UA: NEGATIVE mg/dL
Ketones, ur: NEGATIVE mg/dL
Leukocytes,Ua: NEGATIVE
Nitrite: NEGATIVE
Protein, ur: 30 mg/dL — AB
RBC / HPF: >50 RBC/hpf — ABNORMAL HIGH (ref 0–5)
Specific Gravity, Urine: 1.015 (ref 1.005–1.030)
pH: 9 — ABNORMAL HIGH (ref 5.0–8.0)

## 2021-05-28 LAB — COMPREHENSIVE METABOLIC PANEL
ALT: 16 U/L (ref 0–44)
AST: 25 U/L (ref 15–41)
Albumin: 3.8 g/dL (ref 3.5–5.0)
Alkaline Phosphatase: 58 U/L (ref 38–126)
Anion gap: 10 (ref 5–15)
BUN: 8 mg/dL (ref 6–20)
CO2: 22 mmol/L (ref 22–32)
Calcium: 8.8 mg/dL — ABNORMAL LOW (ref 8.9–10.3)
Chloride: 106 mmol/L (ref 98–111)
Creatinine, Ser: 0.75 mg/dL (ref 0.44–1.00)
GFR, Estimated: >60 mL/min (ref >60)
Glucose, Bld: 138 mg/dL — ABNORMAL HIGH (ref 70–99)
Potassium: 4.2 mmol/L (ref 3.5–5.1)
Sodium: 138 mmol/L (ref 135–145)
Total Bilirubin: 0.5 mg/dL (ref 0.3–1.2)
Total Protein: 7.1 g/dL (ref 6.5–8.1)

## 2021-05-28 LAB — LIPASE, BLOOD: Lipase: 42 U/L (ref 11–51)

## 2021-05-28 LAB — CBC
HCT: 40.6 % (ref 36.0–46.0)
Hemoglobin: 13.3 g/dL (ref 12.0–15.0)
MCH: 32.6 pg (ref 26.0–34.0)
MCHC: 32.8 g/dL (ref 30.0–36.0)
MCV: 99.5 fL (ref 80.0–100.0)
Platelets: 235 10*3/uL (ref 150–400)
RBC: 4.08 MIL/uL (ref 3.87–5.11)
RDW: 11.8 % (ref 11.5–15.5)
WBC: 4.8 10*3/uL (ref 4.0–10.5)
nRBC: 0 % (ref 0.0–0.2)

## 2021-05-28 LAB — I-STAT BETA HCG BLOOD, ED (MC, WL, AP ONLY): I-stat hCG, quantitative: <5 m[IU]/mL (ref <5)

## 2021-05-28 MED ORDER — SODIUM CHLORIDE 0.9 % IV BOLUS
1000.0000 mL | Freq: Once | INTRAVENOUS | Status: AC
  Administered 2021-05-28: 1000 mL via INTRAVENOUS

## 2021-05-28 MED ORDER — KETOROLAC TROMETHAMINE 15 MG/ML IJ SOLN
15.0000 mg | Freq: Once | INTRAMUSCULAR | Status: AC
  Administered 2021-05-28: 15 mg via INTRAVENOUS
  Filled 2021-05-28: qty 1

## 2021-05-28 MED ORDER — ONDANSETRON 4 MG PO TBDP: 4.0000 mg | ORAL_TABLET | Freq: Three times a day (TID) | ORAL | 0 refills | Status: DC | PRN

## 2021-05-28 MED ORDER — MORPHINE SULFATE (PF) 4 MG/ML IV SOLN
4.0000 mg | Freq: Once | INTRAVENOUS | Status: AC
  Administered 2021-05-28: 4 mg via INTRAVENOUS
  Filled 2021-05-28: qty 1

## 2021-05-28 MED ORDER — MORPHINE SULFATE (PF) 4 MG/ML IV SOLN: 4.0000 mg | Freq: Once | INTRAVENOUS | Status: DC

## 2021-05-28 MED ORDER — ONDANSETRON HCL 4 MG/2ML IJ SOLN
4.0000 mg | Freq: Once | INTRAMUSCULAR | Status: AC
  Administered 2021-05-28: 4 mg via INTRAVENOUS
  Filled 2021-05-28: qty 2

## 2021-05-28 MED ORDER — NAPROXEN 500 MG PO TABS: 500.0000 mg | ORAL_TABLET | Freq: Two times a day (BID) | ORAL | 0 refills | Status: DC | PRN

## 2021-05-28 MED ORDER — MORPHINE SULFATE (PF) 2 MG/ML IV SOLN
2.0000 mg | Freq: Once | INTRAVENOUS | Status: AC
  Administered 2021-05-28: 2 mg via INTRAVENOUS
  Filled 2021-05-28: qty 1

## 2021-05-28 NOTE — Discharge Instructions (Addendum)
You were seen in the emergency department today for pelvic/abdominal pain. Your blood pressure was elevated and your labs showed that your blood sugar was mildly elevated- please have these rechecked by primary care.   We are sending you home with naproxen for pain and zofran for nausea.   - Naproxen is a nonsteroidal anti-inflammatory medication that will help with pain and swelling. Be sure to take this medication as prescribed with food, 1 pill every 12 hours,  It should be taken with food, as it can cause stomach upset, and more seriously, stomach bleeding. Do not take other nonsteroidal anti-inflammatory medications with this such as Advil, Motrin, Aleve, Mobic, Goodie Powder, or Motrin.    - Zofran- take this every 8 hours as needed for nausea/vomiting.   You make take Tylenol per over the counter dosing with these medications.   We have prescribed you new medication(s) today. Discuss the medications prescribed today with your pharmacist as they can have adverse effects and interactions with your other medicines including over the counter and prescribed medications. Seek medical evaluation if you start to experience new or abnormal symptoms after taking one of these medicines, seek care immediately if you start to experience difficulty breathing, feeling of your throat closing, facial swelling, or rash as these could be indications of a more serious allergic reaction   Please follow up with obygn within 3 days. Return to the Er for new or worsening symptoms including but not limited to new or worsening pain, inability to keep fluids down, fever, passing out, or any other concerns.

## 2021-05-28 NOTE — ED Provider Notes (Addendum)
[facility] EMERGENCY DEPARTMENT Provider Note   CSN: [id number] Arrival date & time: 05/28/21  0533     History Chief Complaint  Patient presents with   Abdominal Pain    Jenna Heath is a 45 y.o. female with a hx of hypertension who presents to the ED with complaints of abdominal pain that began this morning. Patient states pain is in the pelvic area, radiates to the back at times, no alleviating/aggravating factors. Associated nausea with 4 episodes of emesis. Started her menstrual cycle yesterday. Feels similar to prior severe cramps like when she was in the emergency dept. In February of this year. Denies fever, chills, hematemesis, diarrhea, melena, constipation, vaginal discharge, or dysuria. Denies concern for STI.   HPI     Past Medical History:  Diagnosis Date   Hypertension     There are no problems to display for this patient.   History reviewed. No pertinent surgical history.   OB History   No obstetric history on file.     No family history on file.     Home Medications Prior to Admission medications   Medication Sig Start Date End Date Taking? Authorizing Provider  naproxen (NAPROSYN) 500 MG tablet Take 1 tablet (500 mg total) by mouth 2 (two) times daily as needed for moderate pain. 05/28/21  Yes Fannie Alomar R, PA-C  ondansetron (ZOFRAN ODT) 4 MG disintegrating tablet Take 1 tablet (4 mg total) by mouth every 8 (eight) hours as needed for nausea or vomiting. 05/28/21  Yes Cloe Sockwell R, PA-C  hydrochlorothiazide (MICROZIDE) 12.5 MG capsule Take 12.5 mg by mouth daily. Patient not taking: Reported on 05/28/2021    [provider]    Allergies    Patient has no known allergies.  Review of Systems   Review of Systems  Constitutional:  Negative for chills and fever.  Respiratory:  Negative for shortness of breath.   Cardiovascular:  Negative for chest pain.  Gastrointestinal:  Positive for abdominal pain,  nausea and vomiting. Negative for constipation and diarrhea.  Genitourinary:  Positive for vaginal bleeding. Negative for vaginal discharge.  Neurological:  Negative for syncope.  All other systems reviewed and are negative.  Physical Exam Updated Vital Signs BP (!) 156/117 (BP Location: Left Arm)   Pulse (!) 103   Temp 98.8 F (37.1 C) (Oral)   Resp 16   Ht '5\' 9"'$  (1.753 m)   Wt 88.5 kg   SpO2 99%   BMI 28.80 kg/m   Physical Exam Vitals and nursing note reviewed.  Constitutional:      General: She is not in acute distress.    Appearance: She is well-developed. She is not toxic-appearing.  HENT:     Head: Normocephalic and atraumatic.  Eyes:     General:        Right eye: No discharge.        Left eye: No discharge.     Conjunctiva/sclera: Conjunctivae normal.  Cardiovascular:     Rate and Rhythm: Normal rate and regular rhythm.  Pulmonary:     Effort: Pulmonary effort is normal. No respiratory distress.     Breath sounds: Normal breath sounds. No wheezing, rhonchi or rales.  Abdominal:     General: There is no distension.     Palpations: Abdomen is soft.     Tenderness: There is abdominal tenderness in the suprapubic area. There is no guarding or rebound. Negative signs include Murphy's sign and McBurney's sign.  Hernia: A hernia (reducible) is present.  Musculoskeletal:     Cervical back: Neck supple.  Skin:    General: Skin is warm and dry.     Findings: No rash.  Neurological:     Mental Status: She is alert.     Comments: Clear speech.   Psychiatric:        Behavior: Behavior normal.    ED Results / Procedures / Treatments   Labs (all labs ordered are listed, but only abnormal results are displayed) Labs Reviewed  LIPASE, BLOOD  COMPREHENSIVE METABOLIC PANEL  CBC  URINALYSIS, ROUTINE W REFLEX MICROSCOPIC  I-STAT BETA HCG BLOOD, ED ([facility], [facility], [facility] ONLY)    EKG None  Radiology No results found.  Procedures Procedures   Medications Ordered in  ED Medications - No data to display  ED Course  I have reviewed the triage vital signs and the nursing notes.  Pertinent labs & imaging results that were available during my care of the patient were reviewed by me and considered in my medical decision making (see chart for details).    MDM Rules/Calculators/A&P                           Patient presents to the ED with complaints of pelvic pain.  Nontoxic, vitals w/ elevated BP- doubt HTN emergency, will need PCP recheck. Suprapubic tenderness on exam. I discussed pelvic exam with the patient- she declines.   Additional history obtained:  Additional history obtained from chart review & nursing note review.  February 2022 pelvic US 1. No evidence of ovarian torsion or adnexal mass. 2. Heterogeneity of the myometrium suspicious for adenomyosis. No focal lesion or endometrial thickening.   Lab Tests:  I Ordered, reviewed, and interpreted labs, which included:  CBC: Unremarkable.  CMP: mild hyperglycemia Lipase: WNL UA: blood consistent w/ menses.   ED Course:  Patient presents with pelvic pain, currently on her menstrual cycle.  Given toradol with minimal relief, given morphine as this worked well for her previously with improvement in pain to 5/10 in severity, given some remaining discomfort we discussed pelvic exam and or imaging for further assessment, patient reports this feels exactly as her severe cramps have in the past & declines, repeat abdominal exam remains without peritoneal signs- low suspicion for acute surgical pathology at this time. Preg test negative- doubt ectopic. Patient denies abnormal discharge or concern for STI making PID less likely. No mcburneys point tenderness to suggest appendicitis at this time. She is feeling improved following analgesics in the ED, she is tolerating PO, will discharge home with supportive care & GYN follow up.   I discussed results, treatment plan, need for follow-up, and return  precautions with the patient. Provided opportunity for questions, patient confirmed understanding and is in agreement with plan.   Portions of this note were generated with L[employer]. Dictation errors may occur despite best attempts at proofreading.  Final Clinical Impression(s) / ED Diagnoses Final diagnoses:  Pelvic pain    Rx / DC Orders ED Discharge Orders          Ordered    naproxen (NAPROSYN) 500 MG tablet  2 times daily PRN        05/28/21 0831    ondansetron (ZOFRAN ODT) 4 MG disintegrating tablet  Every 8 hours PRN        05/28/21 0831             Gazelle Towe, Aldona Bar  R, PA-C 05/28/21 0911    Amaryllis Dyke, PA-C 05/28/21 [id number]    Charlesetta Shanks, MD 06/11/21 1443

## 2021-05-28 NOTE — ED Notes (Signed)
Patient states she doesn't have any family here that can take her home. Patient said she feels fine to drive home. Explained to patient that we cannot allow her to drive home after IV morphine. Per PA and MD, the patient would have to wait her at least 2-3 hours before being allowed to drive home. Patient decided to call an uber to pick her up. Provider and charge RN made aware.

## 2021-05-28 NOTE — ED Notes (Signed)
This tech attempted to get blood form pt. Pt became verbally aggressive and drew hand back when attempting to stick. This tech asked to redraw pt stated "I'm okay I want your supervisor"

## 2021-05-28 NOTE — ED Triage Notes (Signed)
Pt hunched over in wheelchair during triage complaining of severe lower abdominal pain and cramps. Stated to day is the first day of menstrual cycle. Pt expressed irritation during triage with this RN stating "I have never been asked those questions before".

## 2021-05-28 NOTE — ED Notes (Signed)
Patient d/c at this time. Patient drover herself. Patient working on getting a ride at this time.

## 2021-06-24 ENCOUNTER — Other Ambulatory Visit: Payer: Self-pay

## 2021-06-24 ENCOUNTER — Emergency Department (HOSPITAL_COMMUNITY)
Admission: EM | Admit: 2021-06-24 | Discharge: 2021-06-24 | Disposition: A | Discharge status: Home / Self Care | Payer: Medicaid Other | Attending: Emergency Medicine | Admitting: Emergency Medicine

## 2021-06-24 ENCOUNTER — Encounter (HOSPITAL_COMMUNITY): Payer: Self-pay | Admitting: Emergency Medicine

## 2021-06-24 DIAGNOSIS — R112 Nausea with vomiting, unspecified: Secondary | ICD-10-CM | POA: Insufficient documentation

## 2021-06-24 DIAGNOSIS — N9489 Other specified conditions associated with female genital organs and menstrual cycle: Secondary | ICD-10-CM | POA: Diagnosis not present

## 2021-06-24 DIAGNOSIS — N938 Other specified abnormal uterine and vaginal bleeding: Secondary | ICD-10-CM | POA: Insufficient documentation

## 2021-06-24 DIAGNOSIS — Z79899 Other long term (current) drug therapy: Secondary | ICD-10-CM | POA: Insufficient documentation

## 2021-06-24 DIAGNOSIS — R000 Tachycardia, unspecified: Secondary | ICD-10-CM | POA: Insufficient documentation

## 2021-06-24 DIAGNOSIS — R109 Unspecified abdominal pain: Secondary | ICD-10-CM | POA: Diagnosis present

## 2021-06-24 DIAGNOSIS — N800 Endometriosis of uterus: Secondary | ICD-10-CM | POA: Diagnosis not present

## 2021-06-24 DIAGNOSIS — R197 Diarrhea, unspecified: Secondary | ICD-10-CM | POA: Diagnosis not present

## 2021-06-24 DIAGNOSIS — R6883 Chills (without fever): Secondary | ICD-10-CM | POA: Insufficient documentation

## 2021-06-24 DIAGNOSIS — I10 Essential (primary) hypertension: Secondary | ICD-10-CM | POA: Diagnosis not present

## 2021-06-24 DIAGNOSIS — N8003 Adenomyosis of the uterus: Secondary | ICD-10-CM

## 2021-06-24 LAB — URINALYSIS, ROUTINE W REFLEX MICROSCOPIC
Bilirubin Urine: NEGATIVE
Glucose, UA: NEGATIVE mg/dL
Ketones, ur: 40 mg/dL — AB
Leukocytes,Ua: NEGATIVE
Nitrite: NEGATIVE
Protein, ur: NEGATIVE mg/dL
Specific Gravity, Urine: 1.02 (ref 1.005–1.030)
pH: 7 (ref 5.0–8.0)

## 2021-06-24 LAB — URINALYSIS, MICROSCOPIC (REFLEX): RBC / HPF: >50 RBC/hpf (ref 0–5)

## 2021-06-24 LAB — COMPREHENSIVE METABOLIC PANEL
ALT: 22 U/L (ref 0–44)
AST: 29 U/L (ref 15–41)
Albumin: 4 g/dL (ref 3.5–5.0)
Alkaline Phosphatase: 57 U/L (ref 38–126)
Anion gap: 11 (ref 5–15)
BUN: 8 mg/dL (ref 6–20)
CO2: 21 mmol/L — ABNORMAL LOW (ref 22–32)
Calcium: 9.1 mg/dL (ref 8.9–10.3)
Chloride: 106 mmol/L (ref 98–111)
Creatinine, Ser: 0.7 mg/dL (ref 0.44–1.00)
GFR, Estimated: >60 mL/min (ref >60)
Glucose, Bld: 128 mg/dL — ABNORMAL HIGH (ref 70–99)
Potassium: 3.9 mmol/L (ref 3.5–5.1)
Sodium: 138 mmol/L (ref 135–145)
Total Bilirubin: 0.7 mg/dL (ref 0.3–1.2)
Total Protein: 7.6 g/dL (ref 6.5–8.1)

## 2021-06-24 LAB — CBC
HCT: 40.8 % (ref 36.0–46.0)
Hemoglobin: 13.2 g/dL (ref 12.0–15.0)
MCH: 32.3 pg (ref 26.0–34.0)
MCHC: 32.4 g/dL (ref 30.0–36.0)
MCV: 99.8 fL (ref 80.0–100.0)
Platelets: 228 10*3/uL (ref 150–400)
RBC: 4.09 MIL/uL (ref 3.87–5.11)
RDW: 11.7 % (ref 11.5–15.5)
WBC: 5.5 10*3/uL (ref 4.0–10.5)
nRBC: 0 % (ref 0.0–0.2)

## 2021-06-24 LAB — I-STAT BETA HCG BLOOD, ED (MC, WL, AP ONLY): I-stat hCG, quantitative: <5 m[IU]/mL (ref <5)

## 2021-06-24 LAB — LIPASE, BLOOD: Lipase: 35 U/L (ref 11–51)

## 2021-06-24 MED ORDER — NAPROXEN 500 MG PO TABS: 500.0000 mg | ORAL_TABLET | Freq: Two times a day (BID) | ORAL | 0 refills | Status: DC | PRN

## 2021-06-24 MED ORDER — KETOROLAC TROMETHAMINE 60 MG/2ML IM SOLN
30.0000 mg | Freq: Once | INTRAMUSCULAR | Status: AC
  Administered 2021-06-24: 30 mg via INTRAMUSCULAR
  Filled 2021-06-24: qty 2

## 2021-06-24 MED ORDER — MORPHINE SULFATE (PF) 4 MG/ML IV SOLN
4.0000 mg | Freq: Once | INTRAVENOUS | Status: AC
Start: 2021-06-24 — End: 2021-06-24
  Administered 2021-06-24: 4 mg via INTRAVENOUS
  Filled 2021-06-24: qty 1

## 2021-06-24 MED ORDER — ONDANSETRON 4 MG PO TBDP: 4.0000 mg | ORAL_TABLET | Freq: Three times a day (TID) | ORAL | 0 refills | Status: DC | PRN

## 2021-06-24 MED ORDER — SODIUM CHLORIDE 0.9 % IV BOLUS
1000.0000 mL | Freq: Once | INTRAVENOUS | Status: AC
  Administered 2021-06-24: 1000 mL via INTRAVENOUS

## 2021-06-24 MED ORDER — ONDANSETRON HCL 4 MG/2ML IJ SOLN
4.0000 mg | Freq: Once | INTRAMUSCULAR | Status: AC
  Administered 2021-06-24: 4 mg via INTRAVENOUS
  Filled 2021-06-24: qty 2

## 2021-06-24 MED ORDER — KETOROLAC TROMETHAMINE 30 MG/ML IJ SOLN
30.0000 mg | Freq: Once | INTRAMUSCULAR | Status: AC
  Administered 2021-06-24: 30 mg via INTRAVENOUS
  Filled 2021-06-24: qty 1

## 2021-06-24 NOTE — Discharge Instructions (Addendum)
Your previous scans have showed adenomyosis. This is similar to endometriosis, however it is specifically located in the uterine wall.  He should follow-up about this problem with a women's health clinic as soon as he can.  I am attaching 1 to your discharge papers however you may also call the number from your previous discharge papers.  I have recent the nausea and pain medication to your pharmacy.  You may also use ibuprofen for your cramps.  It was a pleasure to meet you and I hope that you feel better.

## 2021-06-24 NOTE — ED Notes (Signed)
Patient given discharge instructions, all questions answered. Patient in possession of all belongings, directed to the discharge area  

## 2021-06-24 NOTE — ED Triage Notes (Signed)
Pt bent over in wheelchair during triage.  Reports lower abd cramping, nausea, vomiting, and diarrhea that started today.  States today is first day of menstrual cycle.  Pt here for same on 8/21.

## 2021-06-24 NOTE — ED Provider Notes (Signed)
[facility] EMERGENCY DEPARTMENT Provider Note   CSN: [id number] Arrival date & time: 06/24/21  0745     History Chief Complaint  Patient presents with   Abdominal Pain    Jenna Heath is a 45 y.o. female pmh HTN, adenomyosis presenting with a complaint of abdominal pain that began this morning.  Patient reports that this morning her menstrual cycle began and she became nauseated and multiple episodes of emesis.  Patient reports that she was here last month for similar symptoms and that she feels this way each time her cycle comes on.  Reports being referred to an OB/GYN last month however she was unable to attend this appointment due to working 2 jobs and being in a single mother.  Patient without history of abdominal surgery.  Denies dysuria, hematuria, vaginal discharge or concern for pregnancy.  Reports that her cycle that began this morning feels similar to her last ones.  Denies any fevers however feels as though she has chills.  She reports that she did not eat anything different than she usually does.  No food allergies.  Remaining still helps her symptoms mildly however she is very uncomfortable because she has not been able to keep anything down.   Abdominal Pain Associated symptoms: chills, diarrhea and vomiting   Associated symptoms: no chest pain, no fever, no shortness of breath and no sore throat       Past Medical History:  Diagnosis Date   Hypertension     There are no problems to display for this patient.   History reviewed. No pertinent surgical history.   OB History   No obstetric history on file.     No family history on file.  Social History   Tobacco Use   Smoking status: Never   Smokeless tobacco: Never  Substance Use Topics   Alcohol use: Not Currently   Drug use: Not Currently    Home Medications Prior to Admission medications   Medication Sig Start Date End Date Taking? Authorizing Provider  hydrochlorothiazide  (MICROZIDE) 12.5 MG capsule Take 12.5 mg by mouth daily. Patient not taking: Reported on 05/28/2021    [provider]  naproxen (NAPROSYN) 500 MG tablet Take 1 tablet (500 mg total) by mouth 2 (two) times daily as needed for moderate pain. 05/28/21   Petrucelli, Samantha R, PA-C  ondansetron (ZOFRAN ODT) 4 MG disintegrating tablet Take 1 tablet (4 mg total) by mouth every 8 (eight) hours as needed for nausea or vomiting. 05/28/21   Petrucelli, Glynda Jaeger, PA-C    Allergies    Patient has no known allergies.  Review of Systems   Review of Systems  Constitutional:  Positive for chills. Negative for fever.  HENT:  Negative for sore throat and trouble swallowing.   Respiratory:  Negative for shortness of breath.   Cardiovascular:  Negative for chest pain and palpitations.  Gastrointestinal:  Positive for abdominal pain, diarrhea and vomiting. Negative for blood in stool.  Genitourinary: Negative.   Neurological:  Positive for dizziness. Negative for headaches.  All other systems reviewed and are negative.  Physical Exam Updated Vital Signs BP (!) 151/111 (BP Location: Left Arm)   Pulse (!) 101   Temp 99 F (37.2 C)   Resp 16   LMP 06/24/2021   SpO2 98%   Physical Exam Vitals and nursing note reviewed.  Constitutional:      General: She is in acute distress.     Appearance: Normal appearance. She is not  ill-appearing, toxic-appearing or diaphoretic.  HENT:     Head: Normocephalic and atraumatic.  Eyes:     General: No scleral icterus.    Conjunctiva/sclera: Conjunctivae normal.  Cardiovascular:     Rate and Rhythm: Regular rhythm. Tachycardia present.  Pulmonary:     Effort: Pulmonary effort is normal. No respiratory distress.     Breath sounds: Normal breath sounds.  Abdominal:     Palpations: Abdomen is rigid.     Tenderness: There is abdominal tenderness in the suprapubic area. There is guarding. There is no right CVA tenderness or left CVA tenderness.  Skin:     General: Skin is warm and dry.     Findings: No rash.  Neurological:     Mental Status: She is alert.  Psychiatric:        Mood and Affect: Mood normal.        Behavior: Behavior normal.    ED Results / Procedures / Treatments   Labs (all labs ordered are listed, but only abnormal results are displayed) Labs Reviewed  COMPREHENSIVE METABOLIC PANEL - Abnormal; Notable for the following components:      Result Value   CO2 21 (*)    Glucose, Bld 128 (*)    All other components within normal limits  URINALYSIS, ROUTINE W REFLEX MICROSCOPIC - Abnormal; Notable for the following components:   Hgb urine dipstick LARGE (*)    Ketones, ur 40 (*)    All other components within normal limits  URINALYSIS, MICROSCOPIC (REFLEX) - Abnormal; Notable for the following components:   Bacteria, UA RARE (*)    All other components within normal limits  LIPASE, BLOOD  CBC  I-STAT BETA HCG BLOOD, ED ([facility], [facility], [facility] ONLY)    EKG None  Radiology No results found.  Procedures Procedures   Medications Ordered in ED Medications  sodium chloride 0.9 % bolus 1,000 mL (has no administration in time range)  morphine 4 MG/ML injection 4 mg (has no administration in time range)  ondansetron (ZOFRAN) injection 4 mg (has no administration in time range)    ED Course  I have reviewed the triage vital signs and the nursing notes.  Pertinent labs & imaging results that were available during my care of the patient were reviewed by me and considered in my medical decision making (see chart for details).  Clinical Course as of 06/24/21 1253  Sat Jun 24, 2021  1222 Lipase: 35 [MR]    Clinical Course User Index [MR] Jenna Heath, Cecilio Asper, PA-C   MDM Rules/Calculators/A&P  Jenna Heath is a 45 y.o. female pmh HTN, adenomyosis presenting with a complaint of abdominal pain that began this morning.  Patient reports that this morning her menstrual cycle began and she became nauseated and multiple episodes of  emesis.  Patient reports that she was here last month for similar symptoms and that she feels this way each time her cycle comes on.  Reports being referred to an OB/GYN last month however she was unable to attend this appointment due to working 2 jobs and being in a single mother.  Patient without history of abdominal surgery.  Denies dysuria, hematuria, vaginal discharge or concern for pregnancy.  Reports that her cycle that began this morning feels similar to her last ones.  Denies any fevers however feels as though she has chills.  She reports that she did not eat anything different than she usually does.  No food allergies.  Remaining still helps her symptoms mildly however she  is very uncomfortable because she has not been able to keep anything down.  The patient and I discussed her adenomyosis.  She denied being aware of this diagnosis during her previous visits.  She did state that she was instructed to follow-up with an OB/GYN however was unable to due to social concerns.  Has not been on OCPs.  We discussed that although we may treat her symptoms acutely, we are unable to provide chronic care and that follow-up for this problem is very important.  Her youngest son was in the room asking if the patient was going to need surgery and adamant that she get her pain medication as soon as possible.  I told him that I am unable to say whether or not she is absolutely going to need surgery.  I also reported that the staff has many patients and the nurses will be in as soon as possible.  Patient did not have any tenderness to her abdomen.  Only tender to suprapubic palpation, which is consistent with her menstrual disorder.  Patient without fever however has been tachycardic in the department today.  Also hypertensive however patient reports that she was unable to keep down her hypertension medication.  I believe her tachycardia to be secondary to pain and lack of hydration.  For this I gave the patient an IV  fluid bolus and some morphine through her IV. Endorsed continued pain.  I ordered '30mg'$  of Toradol per nurse request.  Patient reports that this helped her.   Blood work was without concern and the patient is not pregnant in the department today. I suspect hematuria to be due to patient being on her menstrual cycle.  No urinary symptoms.  She denied any abnormal discharge or odor.  Patient deferred pelvic exam today and we discussed that she will obtain 1 at her outpatient appointment.  I will give her an IM injection prior to discharge.  Patient thankful for her care today and will follow up with the women's clinic as soon as she can.  Medications sent to the pharmacy as we discussed.  Final Clinical Impression(s) / ED Diagnoses Final diagnoses:  Adenomyosis  Dysfunctional uterine bleeding    Rx / DC Orders Results and diagnoses were explained to the patient. Return precautions discussed in full. Patient had no additional questions and expressed complete understanding.     Darliss Ridgel 06/24/21 1258    Dorie Rank, MD 06/25/21 1001

## 2021-10-28 ENCOUNTER — Emergency Department (HOSPITAL_COMMUNITY): Payer: Medicaid Other

## 2021-10-28 ENCOUNTER — Emergency Department (HOSPITAL_COMMUNITY)
Admission: EM | Admit: 2021-10-28 | Discharge: 2021-10-28 | Disposition: A | Discharge status: Home / Self Care | Payer: Medicaid Other | Attending: Emergency Medicine | Admitting: Emergency Medicine

## 2021-10-28 ENCOUNTER — Other Ambulatory Visit: Payer: Self-pay

## 2021-10-28 ENCOUNTER — Encounter (HOSPITAL_COMMUNITY): Payer: Self-pay | Admitting: Emergency Medicine

## 2021-10-28 DIAGNOSIS — R1031 Right lower quadrant pain: Secondary | ICD-10-CM

## 2021-10-28 DIAGNOSIS — I10 Essential (primary) hypertension: Secondary | ICD-10-CM | POA: Diagnosis not present

## 2021-10-28 DIAGNOSIS — N9489 Other specified conditions associated with female genital organs and menstrual cycle: Secondary | ICD-10-CM | POA: Insufficient documentation

## 2021-10-28 DIAGNOSIS — N83511 Torsion of right ovary and ovarian pedicle: Secondary | ICD-10-CM | POA: Insufficient documentation

## 2021-10-28 DIAGNOSIS — K439 Ventral hernia without obstruction or gangrene: Secondary | ICD-10-CM | POA: Insufficient documentation

## 2021-10-28 DIAGNOSIS — N83519 Torsion of ovary and ovarian pedicle, unspecified side: Secondary | ICD-10-CM

## 2021-10-28 LAB — COMPREHENSIVE METABOLIC PANEL
ALT: 15 U/L (ref 0–44)
AST: 22 U/L (ref 15–41)
Albumin: 4 g/dL (ref 3.5–5.0)
Alkaline Phosphatase: 58 U/L (ref 38–126)
Anion gap: 10 (ref 5–15)
BUN: 7 mg/dL (ref 6–20)
CO2: 21 mmol/L — ABNORMAL LOW (ref 22–32)
Calcium: 9 mg/dL (ref 8.9–10.3)
Chloride: 105 mmol/L (ref 98–111)
Creatinine, Ser: 0.8 mg/dL (ref 0.44–1.00)
GFR, Estimated: >60 mL/min (ref >60)
Glucose, Bld: 129 mg/dL — ABNORMAL HIGH (ref 70–99)
Potassium: 3.7 mmol/L (ref 3.5–5.1)
Sodium: 136 mmol/L (ref 135–145)
Total Bilirubin: 0.4 mg/dL (ref 0.3–1.2)
Total Protein: 7.3 g/dL (ref 6.5–8.1)

## 2021-10-28 LAB — CBC
HCT: 39.2 % (ref 36.0–46.0)
Hemoglobin: 13 g/dL (ref 12.0–15.0)
MCH: 32.7 pg (ref 26.0–34.0)
MCHC: 33.2 g/dL (ref 30.0–36.0)
MCV: 98.7 fL (ref 80.0–100.0)
Platelets: 221 10*3/uL (ref 150–400)
RBC: 3.97 MIL/uL (ref 3.87–5.11)
RDW: 11.5 % (ref 11.5–15.5)
WBC: 6.1 10*3/uL (ref 4.0–10.5)
nRBC: 0 % (ref 0.0–0.2)

## 2021-10-28 LAB — URINALYSIS, ROUTINE W REFLEX MICROSCOPIC
Bacteria, UA: NONE SEEN
Bilirubin Urine: NEGATIVE
Glucose, UA: NEGATIVE mg/dL
Ketones, ur: NEGATIVE mg/dL
Leukocytes,Ua: NEGATIVE
Nitrite: NEGATIVE
Protein, ur: NEGATIVE mg/dL
Specific Gravity, Urine: 1.017 (ref 1.005–1.030)
pH: 7 (ref 5.0–8.0)

## 2021-10-28 LAB — I-STAT BETA HCG BLOOD, ED (MC, WL, AP ONLY): I-stat hCG, quantitative: <5 m[IU]/mL (ref <5)

## 2021-10-28 LAB — LIPASE, BLOOD: Lipase: 41 U/L (ref 11–51)

## 2021-10-28 IMAGING — CT CT ABD-PELV W/ CM
2 of 5 series · 14 of 46 positions shown, 16 images · IV contrast (APPLIED)
Comparison: None.

CLINICAL DATA: Right lower quadrant abdominal pain.

EXAM:
CT ABDOMEN AND PELVIS WITH CONTRAST
TECHNIQUE: Multidetector CT imaging of the abdomen and pelvis was performed
using the standard protocol following bolus administration of
intravenous contrast.

[Series 3: abd/ pelvis 5.0 i30f 2 · axial · 0.88mm/px · z∈[+797,+1237]mm · 11 of 100 slices shown, 13 images]
[im 6/100  soft-tissue]
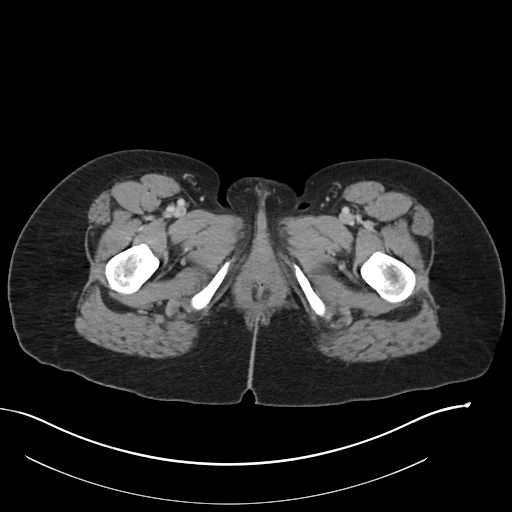
[im 6/100  bone]
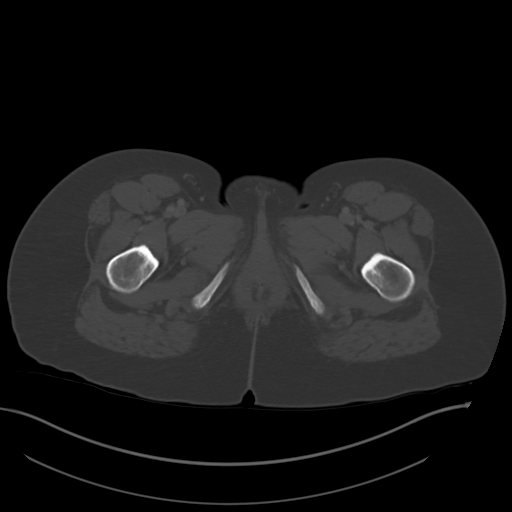
[im 16/100  soft-tissue]
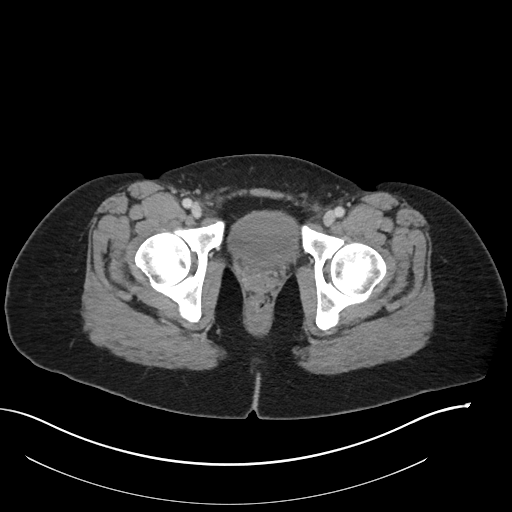
[im 27/100  soft-tissue]
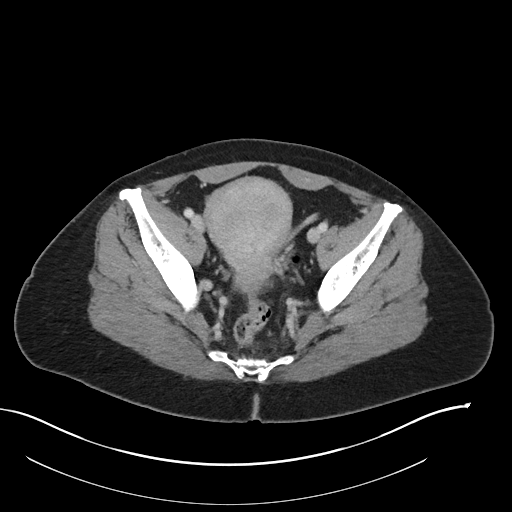
[im 32/100  soft-tissue]
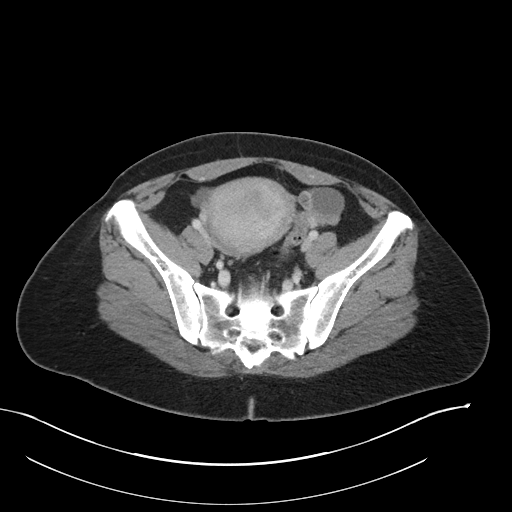
[im 42/100  soft-tissue]
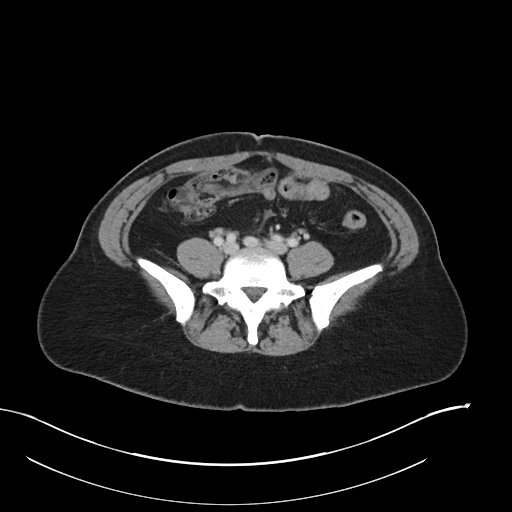
[im 53/100  soft-tissue]
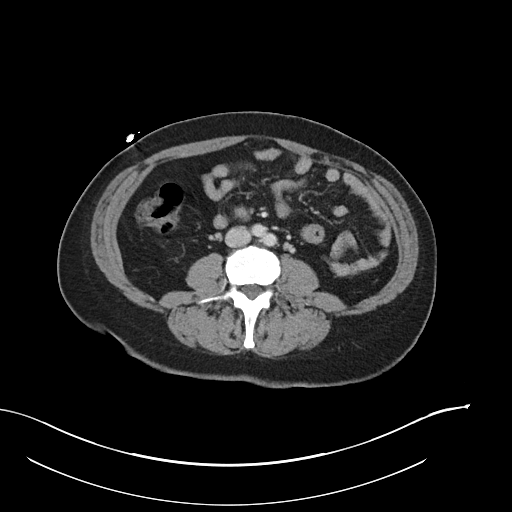
[im 58/100  soft-tissue]
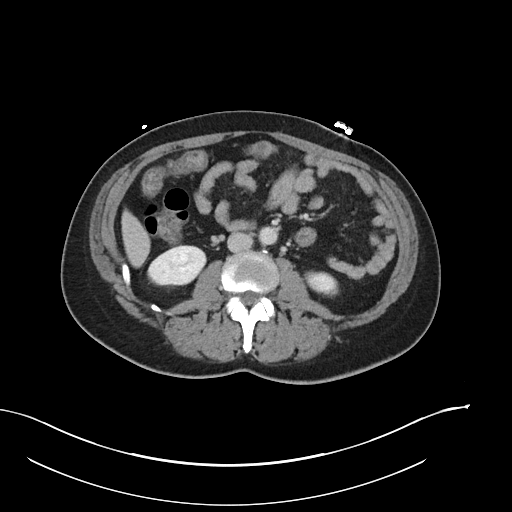
[im 68/100  soft-tissue]
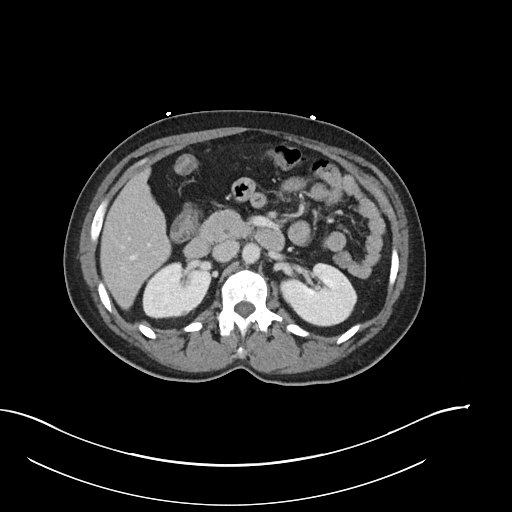
[im 73/100  soft-tissue]
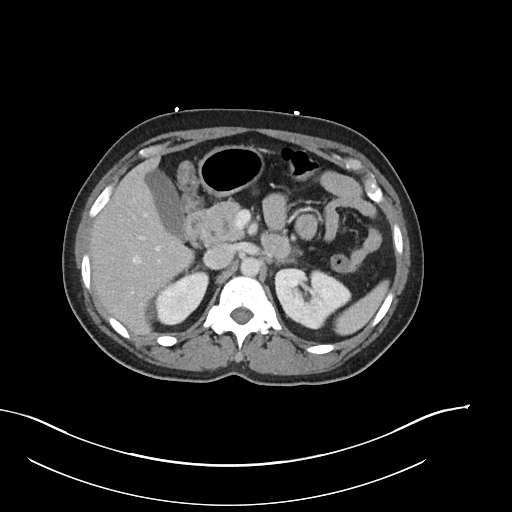
[im 73/100  bone]
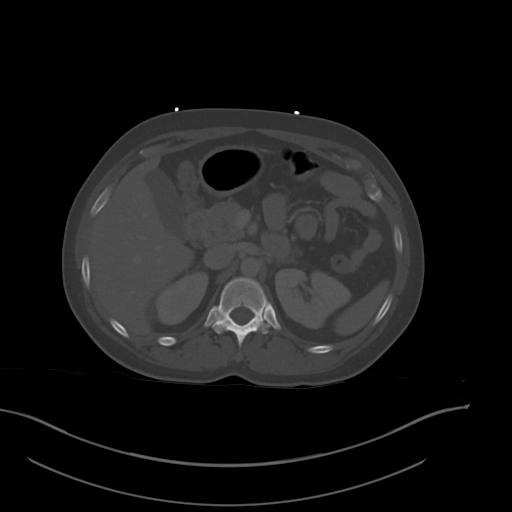
[im 84/100  soft-tissue]
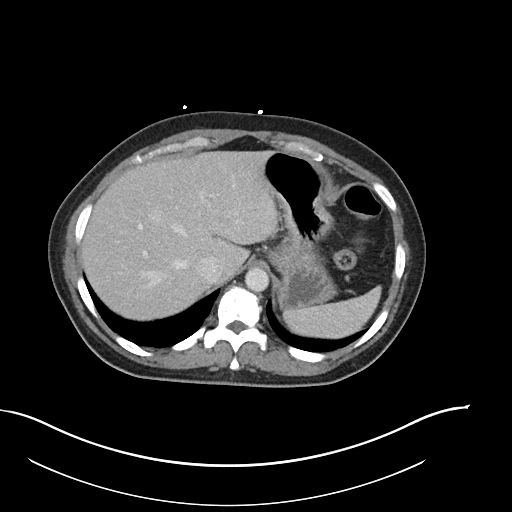
[im 94/100  soft-tissue]
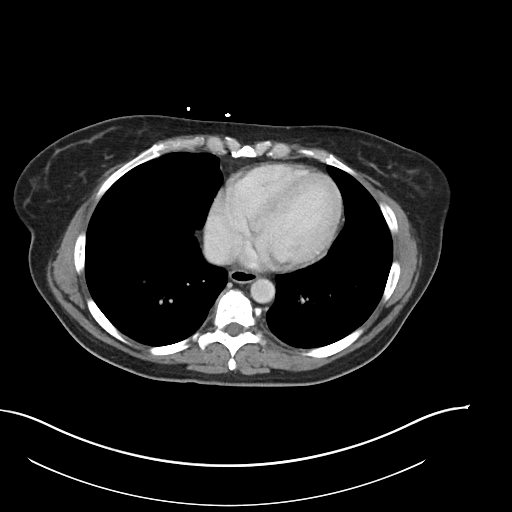

[Series 6: coronal soft tissue · coronal · 0.79mm/px · 3 of 90 slices shown]
[im 30/90  soft-tissue]
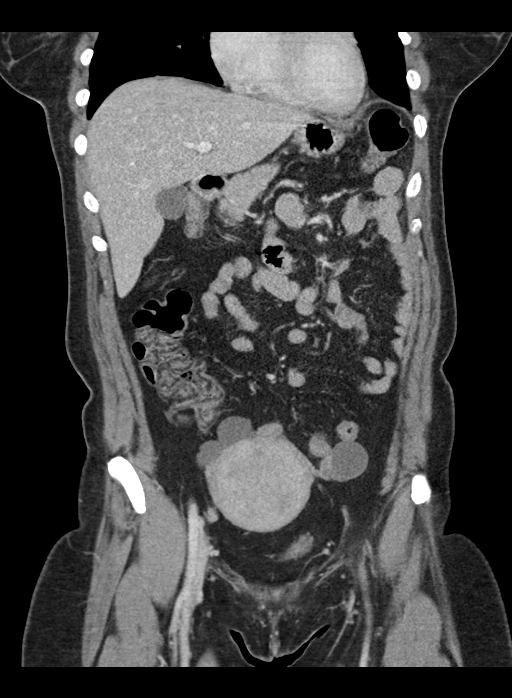
[im 40/90  soft-tissue]
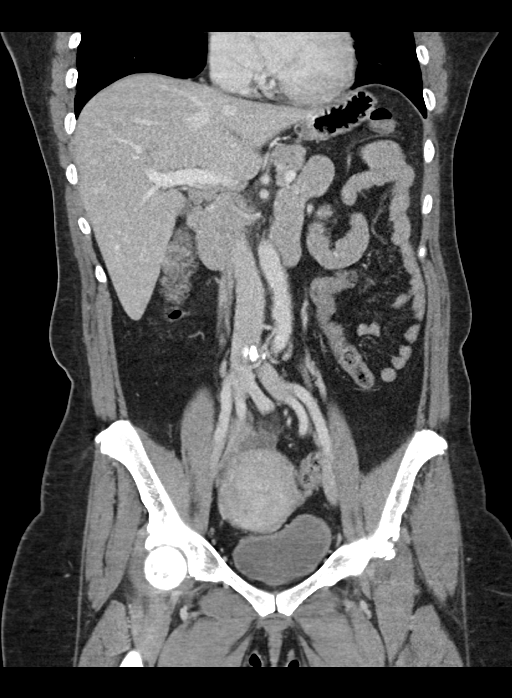
[im 50/90  soft-tissue]
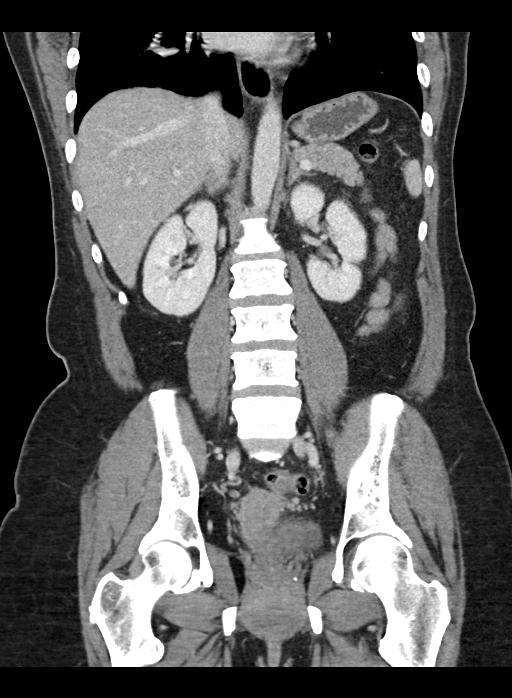

[14 of 46 positions shown; findings below may reference images not displayed]

RADIATION DOSE REDUCTION: This exam was performed according to the
departmental dose-optimization program which includes automated
exposure control, adjustment of the mA and/or kV according to
patient size and/or use of iterative reconstruction technique.

CONTRAST:  100mL OMNIPAQUE IOHEXOL 300 MG/ML  SOLN
FINDINGS: Lower chest: Normal heart size. Dependent atelectasis right lower
lobe. No pleural effusion.

Hepatobiliary: Within the left hepatic lobe there is a 1.5 cm
low-attenuation lesion (image 21; series 3). On delayed images there
is suggestion of peripheral nodular enhancement. Additionally within
the right hepatic lobe there is a 1.2 cm enhancing lesion (image 16;
series 3). Within the left hepatic lobe there is a 0.9 cm enhancing
lesion (image 16; series 3). Gallbladder is unremarkable. No
intrahepatic or extrahepatic biliary ductal dilatation.

Pancreas: Unremarkable

Spleen: Unremarkable

Adrenals/Urinary Tract: Normal adrenal glands. Kidneys enhance
symmetrically with contrast. 2.2 cm cyst superior pole right kidney.
Urinary bladder is unremarkable.

Stomach/Bowel: Normal morphology of the stomach. No evidence for
bowel obstruction. No free fluid or free intraperitoneal air.

The appendix is thickened measuring up to 12 mm and courses
inferiorly to the superior aspect of the uterus. The distal aspect
of the appendix appears dilated with associated low-attenuation
structures (image 63; series 3). There appears to be low-attenuation
amorphous fluid along the superior and right lateral aspect of the
uterus, potentially originating from the distal aspect of the
appendix.

Vascular/Lymphatic: Normal caliber abdominal aorta. No
retroperitoneal lymphadenopathy.

Reproductive: Heterogeneous myometrium. Multiple cysts involving the
ovaries bilaterally. Amorphous fluid is demonstrated along the
superior and right lateral aspect of the uterus.

Other: None.

Musculoskeletal: No aggressive or acute appearing osseous lesions.
IMPRESSION: 1. The appendix is thickened measuring up to 12 mm without
significant inflammatory changes. The distal aspect of the appendix
is abnormal in appearance containing low-attenuation structures.
Additionally, there is amorphous low attenuation fluid extending
from this region along the superior and right lateral aspect of the
uterus. Given the lack of inflammatory change, this is likely not
representative of acute appendicitis however the distal aspect of
the appendix does appear abnormal and may potentially represent a
mucocele. The origin of the amorphous low-attenuation material along
the superior and right lateral aspect of the uterus is unclear
however could potentially be secondary to a ruptured mucocele off
the tip of the appendix or potentially secondary to patient's
reported history of endometriosis. There are adjacent cysts within
the right ovary which is located in this region as well. Given the
abnormal appearance, at a minimum, a short-term follow-up CT or MRI
would be recommended. Recommend surgical consultation given the
possibility of an appendix mucocele, potentially ruptured.
2. There is a low-attenuation lesion within the left hepatic lobe
which potentially represents hemangioma. There are 2 additional
hyperenhancing lesions within the liver. Recommend dedicated
evaluation these lesions in the nonacute setting with abdominal MRI
for definitive characterization.
3. These results were called by telephone at the time of
interpretation on [DATE] at [DATE] to provider KEBICH
KEBICH , who verbally acknowledged these results.

## 2021-10-28 IMAGING — US US PELVIS COMPLETE TRANSABD/TRANSVAG W DUPLEX AND/OR DOPPLER
1 series · 13 of 25 positions shown · non-contrast
Comparison: None.

CLINICAL DATA: Evaluate for ovarian torsion.

EXAM:
TRANSABDOMINAL AND TRANSVAGINAL ULTRASOUND OF PELVIS
DOPPLER ULTRASOUND OF OVARIES
TECHNIQUE: Both transabdominal and transvaginal ultrasound examinations of the
pelvis were performed. Transabdominal technique was performed for
global imaging of the pelvis including uterus, ovaries, adnexal
regions, and pelvic cul-de-sac.
It was necessary to proceed with endovaginal exam following the
transabdominal exam to visualize the adnexal structures. Color and
duplex Doppler ultrasound was utilized to evaluate blood flow to the
ovaries.

[Series 1: us pelvic complete w transvaginal and torsion righ · 53 acquisitions, 13 frames shown]
[im 1/53]
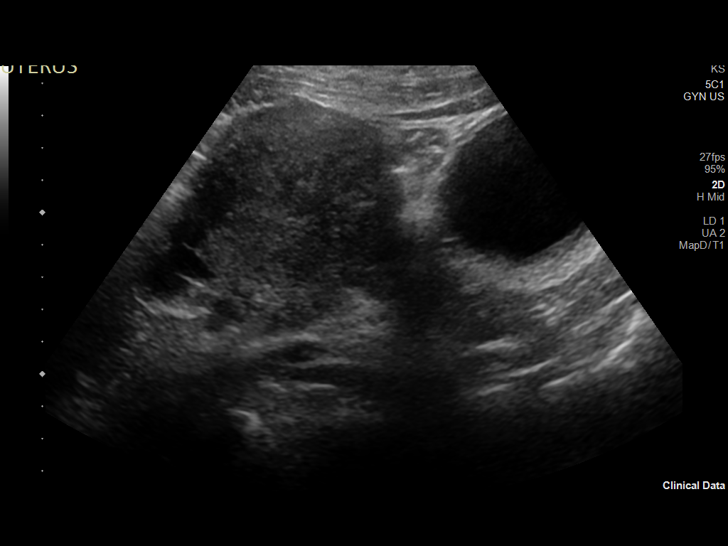
[im 5/53]
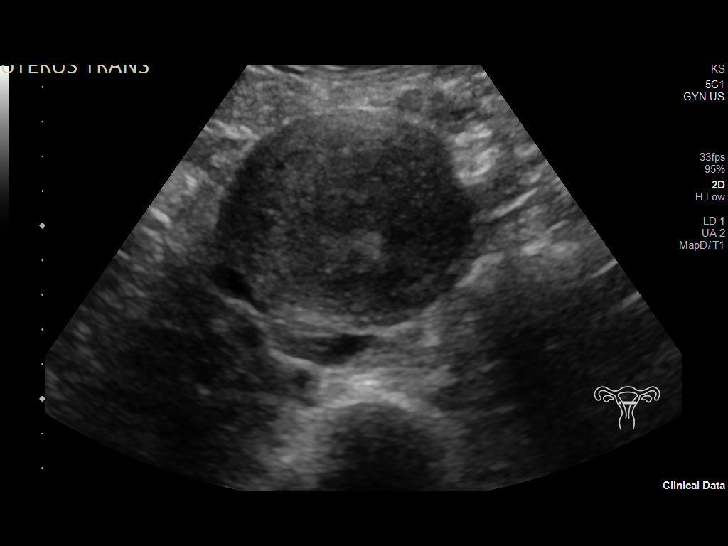
[im 9/53]
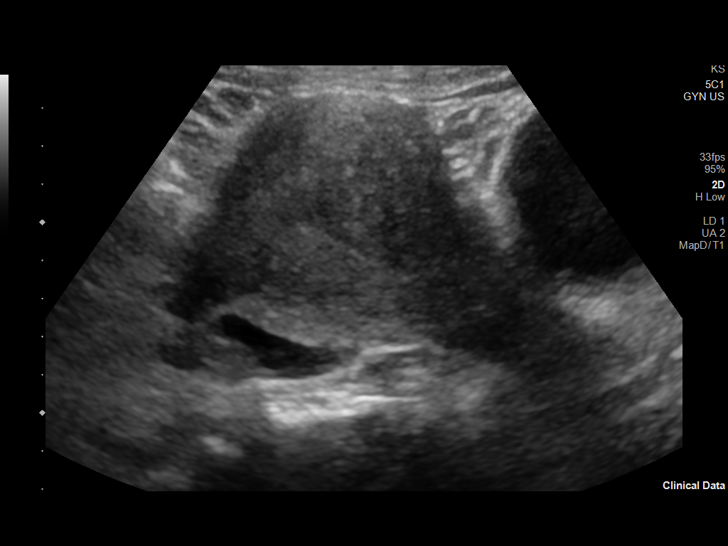
[im 14/53]
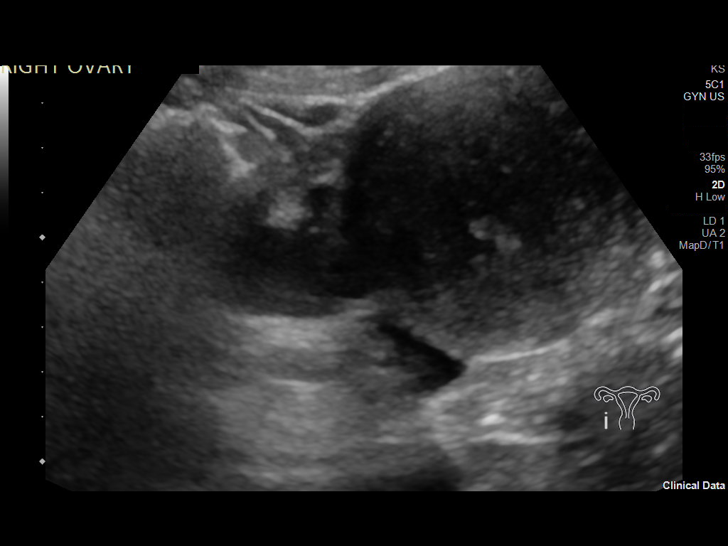
[im 18/53]
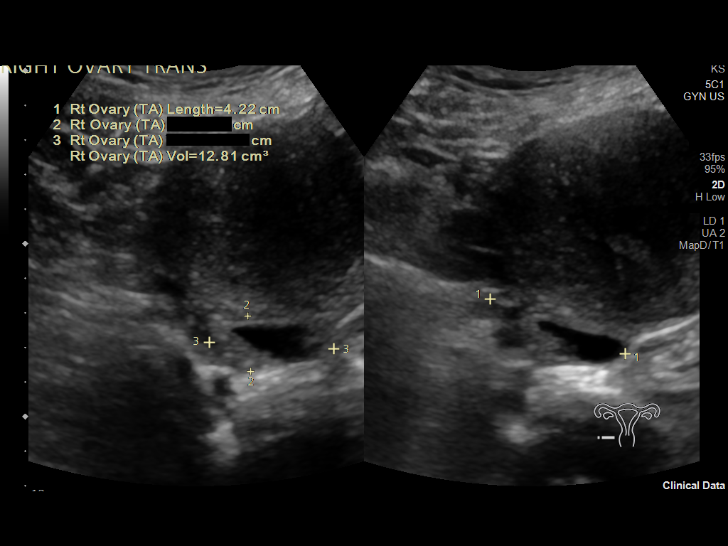
[im 22/53]
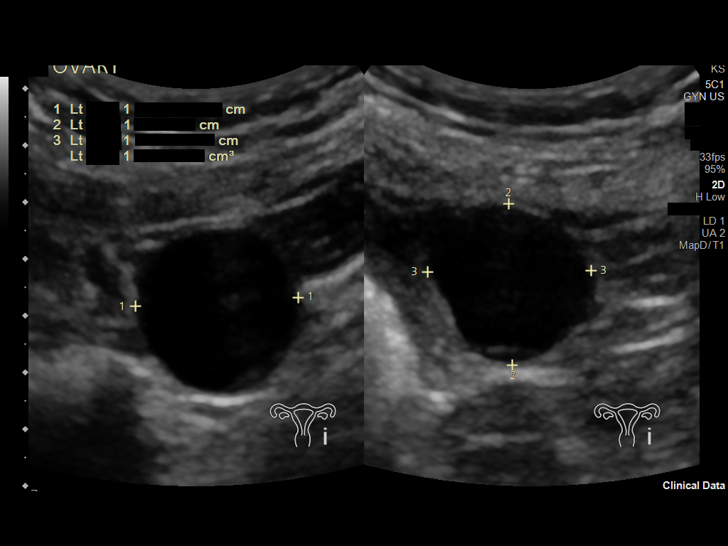
[im 27/53]
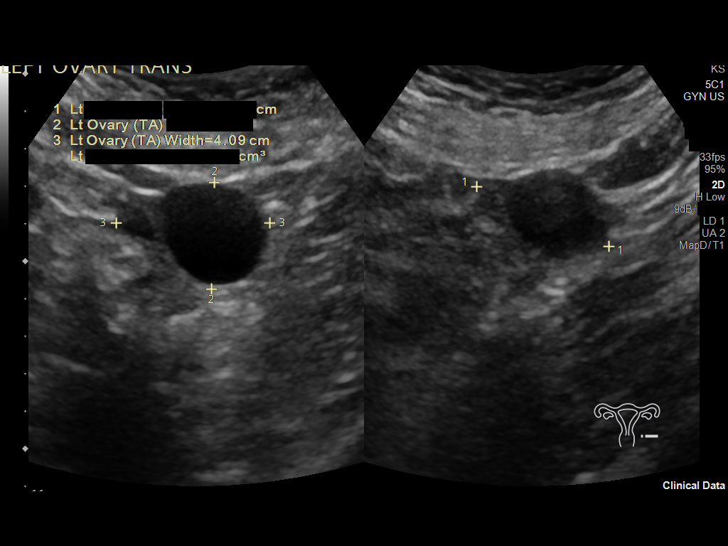
[im 31/53]
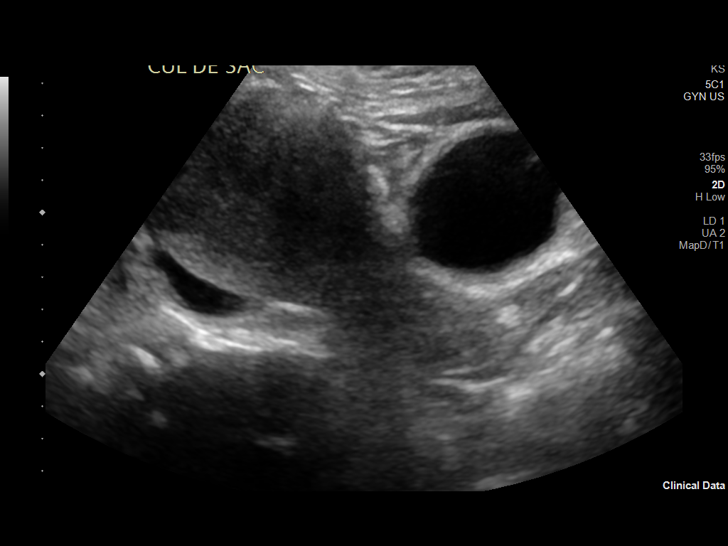
[im 35/53]
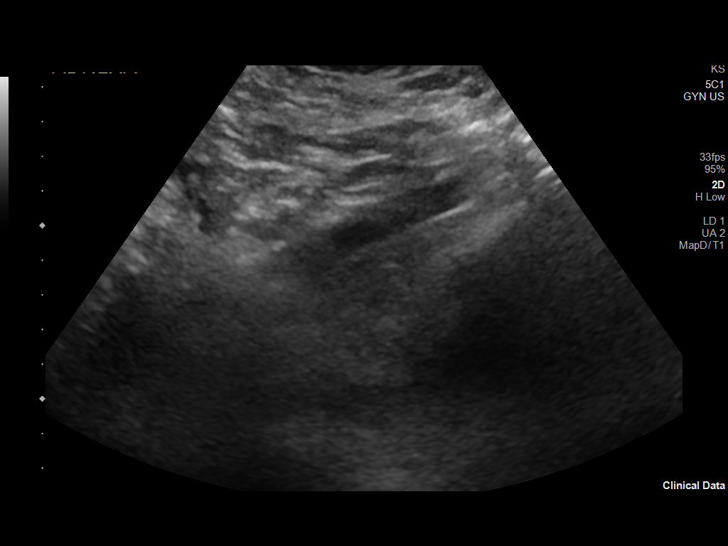
[im 40/53]
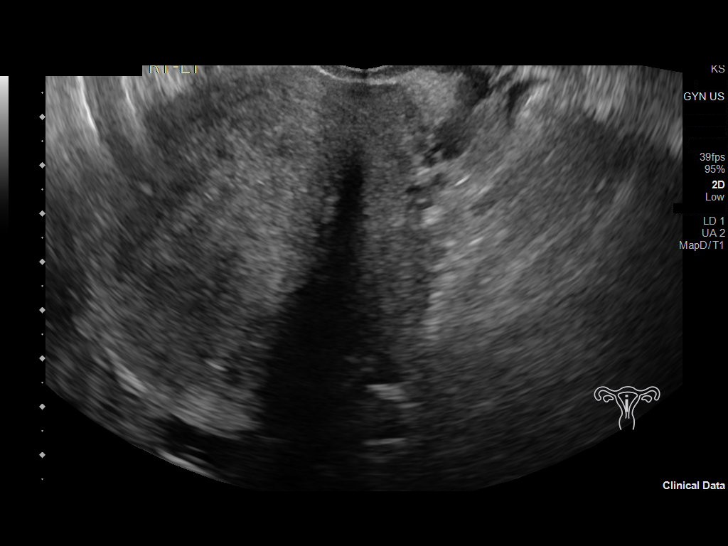
[im 44/53]
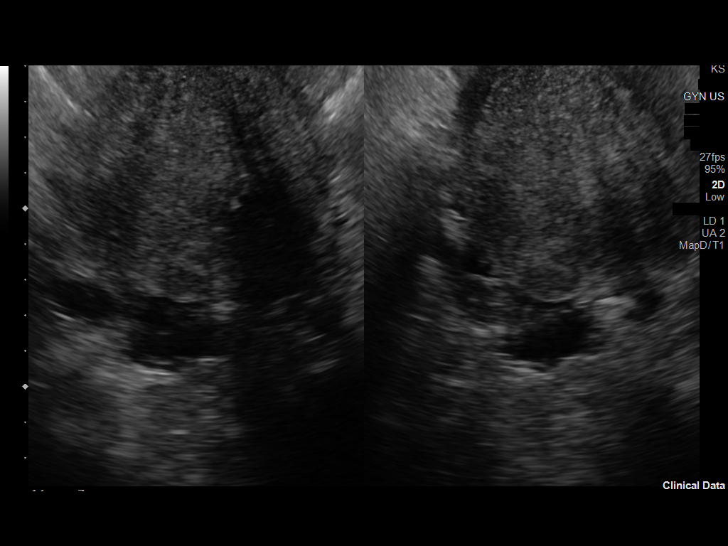
[im 48/53]
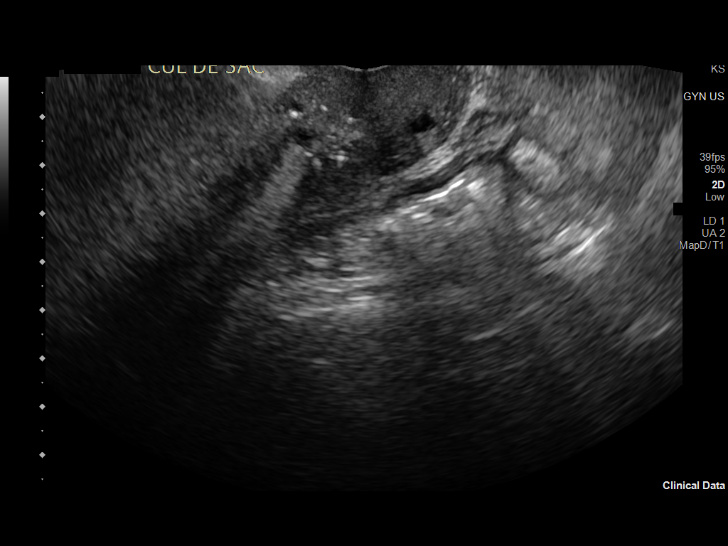
[im 53/53]
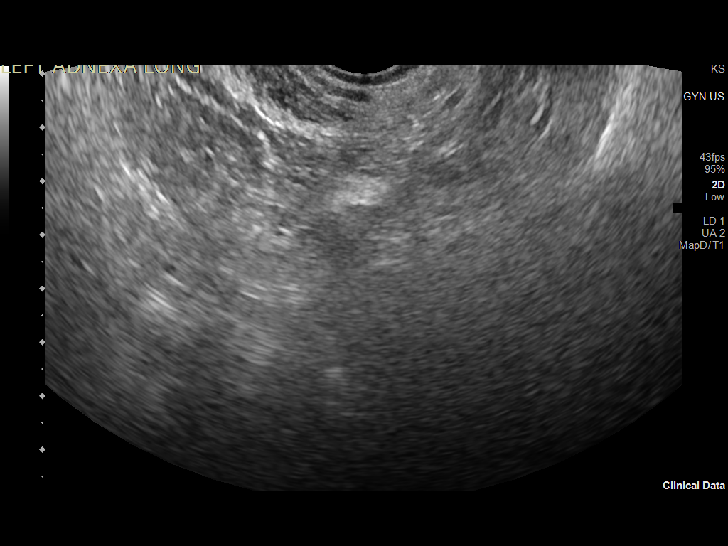

[13 of 25 positions shown; findings below may reference images not displayed]

FINDINGS: Uterus

Measurements: 12.4 x 6.7 x 7.4 cm = volume: 321.25 mL. Myometrium is
heterogeneous in echogenicity.

Endometrium

Thickness: 5 mm.  No focal abnormality visualized.

Right ovary

Measurements: 4.2 x 1.6 x 3.6 cm = volume: 12.8 mL. There is a 2.2 x
1.0 x 1.7 cm cyst within the right ovary.

Left ovary

Measurements: 3.9 x 2.8 x 4.1 cm = volume: 23.5 mL. There is a 2.9 x
2.8 x 2.9 cm cyst within the left ovary.

Pulsed Doppler evaluation of both ovaries demonstrates normal
low-resistance arterial and venous waveforms.

Other findings

No abnormal free fluid.
IMPRESSION: No sonographic evidence to suggest ovarian torsion. No acute process
within the pelvis identified.

Multiple ovarian cysts. Most severe: 2.9 cm left ovarian follicle,
normal finding. No follow-up imaging is recommended.

Reference: Radiology [DATE]):[id number]

Heterogeneous myometrium raising the possibility of adenomyosis.

## 2021-10-28 MED ORDER — MORPHINE SULFATE (PF) 4 MG/ML IV SOLN
4.0000 mg | Freq: Once | INTRAVENOUS | Status: AC
  Administered 2021-10-28: 4 mg via INTRAVENOUS
  Filled 2021-10-28: qty 1

## 2021-10-28 MED ORDER — ONDANSETRON HCL 4 MG/2ML IJ SOLN
4.0000 mg | Freq: Once | INTRAMUSCULAR | Status: AC | PRN
  Administered 2021-10-28: 4 mg via INTRAVENOUS
  Filled 2021-10-28: qty 2

## 2021-10-28 MED ORDER — IOHEXOL 300 MG/ML  SOLN
100.0000 mL | Freq: Once | INTRAMUSCULAR | Status: AC | PRN
  Administered 2021-10-28: 100 mL via INTRAVENOUS

## 2021-10-28 NOTE — ED Notes (Signed)
Pt ambulatory to waiting room. Pt verbalized understanding of discharge instructions.   

## 2021-10-28 NOTE — Discharge Instructions (Addendum)
You came to the emergency department today to be evaluated for your abdominal pain.  Your lab work was reassuring.  The ultrasound of your pelvis showed multiple ovarian cysts at findings which may be consistent with adenomyosis.  Please follow-up with the OB/GYN provider listed on this paperwork for further evaluation/management.  The CT scan of your abdomen and pelvis showed that you have a large appendix.  Please follow-up with [facility] to have a colonoscopy form for further evaluation of your appendix.  The CT scan also showed a lesion within the left hepatic lobe which potentially represents hemangioma.  You may need to have an MRI to better characterize this lesion.    Please take Ibuprofen (Advil, motrin) and Tylenol (acetaminophen) to relieve your pain.    You may take up to 600 MG (3 pills) of normal strength ibuprofen every 8 hours as needed.   You make take tylenol, up to 1,000 mg (two extra strength pills) every 8 hours as needed.   It is safe to take ibuprofen and tylenol at the same time as they work differently.   Do not take more than 3,000 mg tylenol in a 24 hour period (not more than one dose every 8 hours.  Please check all medication labels as many medications such as pain and cold medications may contain tylenol.  Do not drink alcohol while taking these medications.  Do not take other NSAID'S while taking ibuprofen (such as aleve or naproxen).  Please take ibuprofen with food to decrease stomach upset.  Please take your antinausea medication as prescribed.  Get help right away if: Your pain does not go away as soon as your health care provider told you to expect. You cannot stop vomiting. Your pain is only in areas of the abdomen, such as the right side or the left lower portion of the abdomen. Pain on the right side could be caused by appendicitis. You have bloody or black stools, or stools that look like tar. You have severe pain, cramping, or bloating in  your abdomen. You have signs of dehydration, such as: Dark urine, very little urine, or no urine. Cracked lips. Dry mouth. Sunken eyes. Sleepiness. Weakness. You have trouble breathing or chest pain.

## 2021-10-28 NOTE — ED Notes (Signed)
Pt transported to US

## 2021-10-28 NOTE — ED Provider Notes (Signed)
[facility] EMERGENCY DEPARTMENT Provider Note   CSN: [id number] Arrival date & time: 10/28/21  2263     History  Chief Complaint  Patient presents with   Abdominal Pain    Jenna Heath is a 46 y.o. female past medical history of hypertension, and reported endometriosis.  Presents to the emergency department with a chief complaint of right lower quadrant abdominal pain.  Patient reports sudden onset of right lower quadrant abdominal pain that woke her from sleep.  Pain started at 3 AM this morning.  Pain has been constant and progressively worsening since then.  Patient describes pain as sharp.  Patient states that pain radiates to her back.  Patient rates pain 10/10 on pain scale.  States that it feels similar to previous episodes of endometriosis that she has had.  Patient reports taking ibuprofen with no improvement in her symptoms.  Pain is worse with touch and movement.  Patient endorses associated nausea and vomiting.  Patient describes emesis as clear and stomach contents.  Patient denies any fever, chills, abdominal distention, constipation, diarrhea, melena, blood in stool, vaginal pain, vaginal discharge, dysuria, hematuria, urinary urgency, urinary frequency, genital sores or lesions.  Patient reports that she had previous ablation.  LMP started yesterday.  G4 P2-0-2-2.  Patient endorses occasional alcohol use.  Denies any illicit drug use.  Last oral intake yesterday evening   Abdominal Pain Associated symptoms: nausea, vaginal bleeding and vomiting   Associated symptoms: no chest pain, no chills, no constipation, no diarrhea, no dysuria, no fever, no hematuria, no shortness of breath and no vaginal discharge       Home Medications Prior to Admission medications   Medication Sig Start Date End Date Taking? Authorizing Provider  hydrochlorothiazide (MICROZIDE) 12.5 MG capsule Take 12.5 mg by mouth daily. Patient not taking: Reported on 05/28/2021     [provider]  naproxen (NAPROSYN) 500 MG tablet Take 1 tablet (500 mg total) by mouth 2 (two) times daily as needed for moderate pain. 06/24/21   Redwine, Madison A, PA-C  ondansetron (ZOFRAN ODT) 4 MG disintegrating tablet Take 1 tablet (4 mg total) by mouth every 8 (eight) hours as needed for nausea or vomiting. 06/24/21   Redwine, Madison A, PA-C      Allergies    Patient has no known allergies.    Review of Systems   Review of Systems  Constitutional:  Negative for chills and fever.  Eyes:  Negative for visual disturbance.  Respiratory:  Negative for shortness of breath.   Cardiovascular:  Negative for chest pain.  Gastrointestinal:  Positive for abdominal pain, nausea and vomiting. Negative for abdominal distention, anal bleeding, blood in stool, constipation, diarrhea and rectal pain.  Genitourinary:  Positive for vaginal bleeding. Negative for difficulty urinating, dysuria, flank pain, frequency, genital sores, hematuria, pelvic pain, urgency, vaginal discharge and vaginal pain.  Musculoskeletal:  Negative for back pain and neck pain.  Skin:  Negative for color change and rash.  Neurological:  Negative for dizziness, syncope, light-headedness and headaches.  Psychiatric/Behavioral:  Negative for confusion.    Physical Exam Updated Vital Signs BP (!) 175/113    Pulse 92    Temp 98.5 F (36.9 C) (Oral)    Resp 19    SpO2 100%  Physical Exam Vitals and nursing note reviewed.  Constitutional:      General: She is not in acute distress.    Appearance: She is not ill-appearing, toxic-appearing or diaphoretic.  HENT:  Head: Normocephalic.  Eyes:     General: No scleral icterus.       Right eye: No discharge.        Left eye: No discharge.  Cardiovascular:     Rate and Rhythm: Normal rate.  Pulmonary:     Effort: Pulmonary effort is normal.  Abdominal:     General: Abdomen is flat. Bowel sounds are normal. There is no distension. There are no signs of injury.      Palpations: Abdomen is soft. There is no mass or pulsatile mass.     Tenderness: There is abdominal tenderness in the right lower quadrant. There is no right CVA tenderness, left CVA tenderness, guarding or rebound. Positive signs include psoas sign.     Hernia: A hernia is present. Hernia is present in the ventral area. There is no hernia in the umbilical area.     Comments: Easily reducible ventral hernia noticed above patient's umbilicus  Skin:    General: Skin is warm and dry.  Neurological:     General: No focal deficit present.     Mental Status: She is alert.  Psychiatric:        Behavior: Behavior is cooperative.    ED Results / Procedures / Treatments   Labs (all labs ordered are listed, but only abnormal results are displayed) Labs Reviewed  COMPREHENSIVE METABOLIC PANEL - Abnormal; Notable for the following components:      Result Value   CO2 21 (*)    Glucose, Bld 129 (*)    All other components within normal limits  URINALYSIS, ROUTINE W REFLEX MICROSCOPIC - Abnormal; Notable for the following components:   Hgb urine dipstick LARGE (*)    All other components within normal limits  LIPASE, BLOOD  CBC  I-STAT BETA HCG BLOOD, ED ([facility], [facility], [facility] ONLY)    EKG None  Radiology CT ABDOMEN PELVIS W CONTRAST  Result Date: 10/28/2021 CLINICAL DATA:  Right lower quadrant abdominal pain. EXAM: CT ABDOMEN AND PELVIS WITH CONTRAST TECHNIQUE: Multidetector CT imaging of the abdomen and pelvis was performed using the standard protocol following bolus administration of intravenous contrast. RADIATION DOSE REDUCTION: This exam was performed according to the departmental dose-optimization program which includes automated exposure control, adjustment of the mA and/or kV according to patient size and/or use of iterative reconstruction technique. CONTRAST:  162mL OMNIPAQUE IOHEXOL 300 MG/ML  SOLN COMPARISON:  None. FINDINGS: Lower chest: Normal heart size. Dependent atelectasis right lower  lobe. No pleural effusion. Hepatobiliary: Within the left hepatic lobe there is a 1.5 cm low-attenuation lesion (image 21; series 3). On delayed images there is suggestion of peripheral nodular enhancement. Additionally within the right hepatic lobe there is a 1.2 cm enhancing lesion (image 16; series 3). Within the left hepatic lobe there is a 0.9 cm enhancing lesion (image 16; series 3). Gallbladder is unremarkable. No intrahepatic or extrahepatic biliary ductal dilatation. Pancreas: Unremarkable Spleen: Unremarkable Adrenals/Urinary Tract: Normal adrenal glands. Kidneys enhance symmetrically with contrast. 2.2 cm cyst superior pole right kidney. Urinary bladder is unremarkable. Stomach/Bowel: Normal morphology of the stomach. No evidence for bowel obstruction. No free fluid or free intraperitoneal air. The appendix is thickened measuring up to 12 mm and courses inferiorly to the superior aspect of the uterus. The distal aspect of the appendix appears dilated with associated low-attenuation structures (image 63; series 3). There appears to be low-attenuation amorphous fluid along the superior and right lateral aspect of the uterus, potentially originating from the distal aspect of  the appendix. Vascular/Lymphatic: Normal caliber abdominal aorta. No retroperitoneal lymphadenopathy. Reproductive: Heterogeneous myometrium. Multiple cysts involving the ovaries bilaterally. Amorphous fluid is demonstrated along the superior and right lateral aspect of the uterus. Other: None. Musculoskeletal: No aggressive or acute appearing osseous lesions. IMPRESSION: 1. The appendix is thickened measuring up to 12 mm without significant inflammatory changes. The distal aspect of the appendix is abnormal in appearance containing low-attenuation structures. Additionally, there is amorphous low attenuation fluid extending from this region along the superior and right lateral aspect of the uterus. Given the lack of inflammatory change,  this is likely not representative of acute appendicitis however the distal aspect of the appendix does appear abnormal and may potentially represent a mucocele. The origin of the amorphous low-attenuation material along the superior and right lateral aspect of the uterus is unclear however could potentially be secondary to a ruptured mucocele off the tip of the appendix or potentially secondary to patient's reported history of endometriosis. There are adjacent cysts within the right ovary which is located in this region as well. Given the abnormal appearance, at a minimum, a short-term follow-up CT or MRI would be recommended. Recommend surgical consultation given the possibility of an appendix mucocele, potentially ruptured. 2. There is a low-attenuation lesion within the left hepatic lobe which potentially represents hemangioma. There are 2 additional hyperenhancing lesions within the liver. Recommend dedicated evaluation these lesions in the nonacute setting with abdominal MRI for definitive characterization. 3. These results were called by telephone at the time of interpretation on 10/28/2021 at 11:28 am to provider [facility] , who verbally acknowledged these results. Electronically Signed   By: Lovey Newcomer M.D.   On: 10/28/2021 11:28   US PELVIC COMPLETE W TRANSVAGINAL AND TORSION R/O  Result Date: 10/28/2021 CLINICAL DATA:  Evaluate for ovarian torsion. EXAM: TRANSABDOMINAL AND TRANSVAGINAL ULTRASOUND OF PELVIS DOPPLER ULTRASOUND OF OVARIES TECHNIQUE: Both transabdominal and transvaginal ultrasound examinations of the pelvis were performed. Transabdominal technique was performed for global imaging of the pelvis including uterus, ovaries, adnexal regions, and pelvic cul-de-sac. It was necessary to proceed with endovaginal exam following the transabdominal exam to visualize the adnexal structures. Color and duplex Doppler ultrasound was utilized to evaluate blood flow to the ovaries. COMPARISON:   None. FINDINGS: Uterus Measurements: 12.4 x 6.7 x 7.4 cm = volume: 321.25 mL. Myometrium is heterogeneous in echogenicity. Endometrium Thickness: 5 mm.  No focal abnormality visualized. Right ovary Measurements: 4.2 x 1.6 x 3.6 cm = volume: 12.8 mL. There is a 2.2 x 1.0 x 1.7 cm cyst within the right ovary. Left ovary Measurements: 3.9 x 2.8 x 4.1 cm = volume: 23.5 mL. There is a 2.9 x 2.8 x 2.9 cm cyst within the left ovary. Pulsed Doppler evaluation of both ovaries demonstrates normal low-resistance arterial and venous waveforms. Other findings No abnormal free fluid. IMPRESSION: No sonographic evidence to suggest ovarian torsion. No acute process within the pelvis identified. Multiple ovarian cysts. Most severe: 2.9 cm left ovarian follicle, normal finding. No follow-up imaging is recommended. Reference: Radiology 2019 Nov;293(2):359-371 Heterogeneous myometrium raising the possibility of adenomyosis. Electronically Signed   By: Lovey Newcomer M.D.   On: 10/28/2021 10:16    Procedures Procedures    Medications Ordered in ED Medications  morphine 4 MG/ML injection 4 mg (has no administration in time range)  ondansetron (ZOFRAN) injection 4 mg (4 mg Intravenous Given 10/28/21 0802)    ED Course/ Medical Decision Making/ A&P Clinical Course as of 10/28/21 1753  Sat Oct 28, 2021  1125 Spoke to radiologist Dr. Rosana Hoes about findings on CT abdomen pelvis.   [PB]  9323 Spoke to on-call general surgeon Dr. Rosendo Gros.  He independently reviewed imaging and does not feel any surgical intervention is needed at this time.  Advised that patient can follow-up with GI for colonoscopy to further assessment of her appendix.  [PB]    Clinical Course User Index [PB] Loni Beckwith, PA-C                           Medical Decision Making Amount and/or Complexity of Data Reviewed Labs: ordered. Radiology: ordered. ECG/medicine tests: ordered.  Risk Prescription drug management.   This patient presents to  the ED for concern of right lower quadrant abdominal pain, this involves an extensive number of treatment options, and is a complaint that carries with it a high risk of complications and morbidity.  The differential diagnosis includes but is not limited to appendicitis, ovarian torsion, PID, urinary tract infection, pyelonephritis, ureteral/renal calculus.   Co morbidities that complicate the patient evaluation  Endometriosis   Additional history obtained:  External records from outside source obtained and reviewed including previous provider notes, labs, and imaging   Lab Tests:  I Ordered, and personally interpreted labs.  The pertinent results include:   Pregnancy test negative Lipase within normal limits CBC and CMP unremarkable Urinalysis shows bacteria none, WBC 0-5, RBC 6-10, leukocyte negative, nitrite negative.  Suspect that RBC seen is secondary to patient's menstrual period   Imaging Studies ordered:  I ordered imaging studies including CT abdomen pelvis and pelvic ultrasound Ultrasound imaging shows no signs of ovarian torsion, no acute process within the pelvis identified.  Multiple ovarian cysts.  Heterogeneous myometrium. CT abdomen pelvis showed appendix is thickened measuring up to 12 mm without significant inflammatory changes.  Low-attenuation lesion within the left hepatic lobe. I agree with the radiologist interpretation  Medicines ordered and prescription drug management:  I ordered medication including Zofran for nausea.  Morphine for pain management Reevaluation of the patient after these medicines showed that the patient improved I have reviewed the patients home medicines and have made adjustments as needed   Consultations Obtained:  I requested consultation with the general surgery on-call provider Dr. Rosendo Gros,  and discussed lab and imaging findings as well as pertinent plan - they recommend: No surgical intervention at this time.  Advised patient to  follow-up with gastroenterology for colonoscopy in outpatient setting   Problem List / ED Course:  Right lower quadrant abdominal pain Patient had sudden onset of right lower quadrant abdominal pain.  Concern for possible ovarian torsion.  Ultrasound imaging was obtained which showed no ovarian torsion or acute abnormality within the pelvis With tenderness to right lower quadrant and positive psoas sign concern for possible appendicitis.  CT abdomen pelvis was obtained which showed enlarged pelvis with no inflammatory changes.  I personally spoke with radiologist about findings of CT abdomen pelvis.  Due to findings consulted with on-call physician Dr. Bonney Leitz for general surgery. Patient had resolution of nausea after receiving Zofran.  Able to tolerate p.o. intake without difficulty. Patient has improvement in pain after receiving morphine.  On serial reexamination abdomen soft, nondistended, improvement in tenderness. Will have patient follow-up with OB/GYN provider as there is concern for patient's pain being related to her endometriosis.  We will have patient follow-up with gastroenterology per general surgery recommendation. Patient was not prescribed Zofran as she states  that she has a prescription at home which is active.   Reevaluation:  After the interventions noted above, I reevaluated the patient and found that they have :improved    Disposition:   After consideration of the diagnostic results and the patients response to treatment, I feel that the patent would benefit from discharge and follow-up with OB/GYN and gastroenterology.          Final Clinical Impression(s) / ED Diagnoses Final diagnoses:  Ovarian torsion  Right lower quadrant abdominal pain    Rx / DC Orders ED Discharge Orders     None         Loni Beckwith, PA-C 10/28/21 1804    Fredia Sorrow, MD 10/29/21 [id number]

## 2021-10-28 NOTE — ED Triage Notes (Signed)
Pt from home via [facility] with C/O RLQ pain that started 4 hours ago. Pt reports nausea and vomiting. Denies fevers. Pt reports Hx of endometriosis.

## 2021-10-31 ENCOUNTER — Other Ambulatory Visit: Payer: Self-pay | Admitting: Nurse Practitioner

## 2021-10-31 ENCOUNTER — Encounter: Payer: Self-pay | Admitting: Internal Medicine

## 2021-10-31 DIAGNOSIS — Z1231 Encounter for screening mammogram for malignant neoplasm of breast: Secondary | ICD-10-CM

## 2021-11-08 ENCOUNTER — Ambulatory Visit
Admission: RE | Admit: 2021-11-08 | Discharge: 2021-11-08 | Disposition: A | Discharge status: Home / Self Care | Payer: Medicaid Other | Source: Ambulatory Visit | Attending: Nurse Practitioner | Admitting: Nurse Practitioner

## 2021-11-08 DIAGNOSIS — Z1231 Encounter for screening mammogram for malignant neoplasm of breast: Secondary | ICD-10-CM

## 2021-11-08 IMAGING — MG MM DIGITAL SCREENING BILAT W/ TOMO AND CAD
8 series · 8 of 24 positions shown · non-contrast
Comparison: Previous exam(s).

CLINICAL DATA: Screening.

EXAM:
DIGITAL SCREENING BILATERAL MAMMOGRAM WITH TOMOSYNTHESIS AND CAD
TECHNIQUE: Bilateral screening digital craniocaudal and mediolateral oblique
mammograms were obtained. Bilateral screening digital breast
tomosynthesis was performed. The images were evaluated with
computer-aided detection.

[L MLO synth-2D]
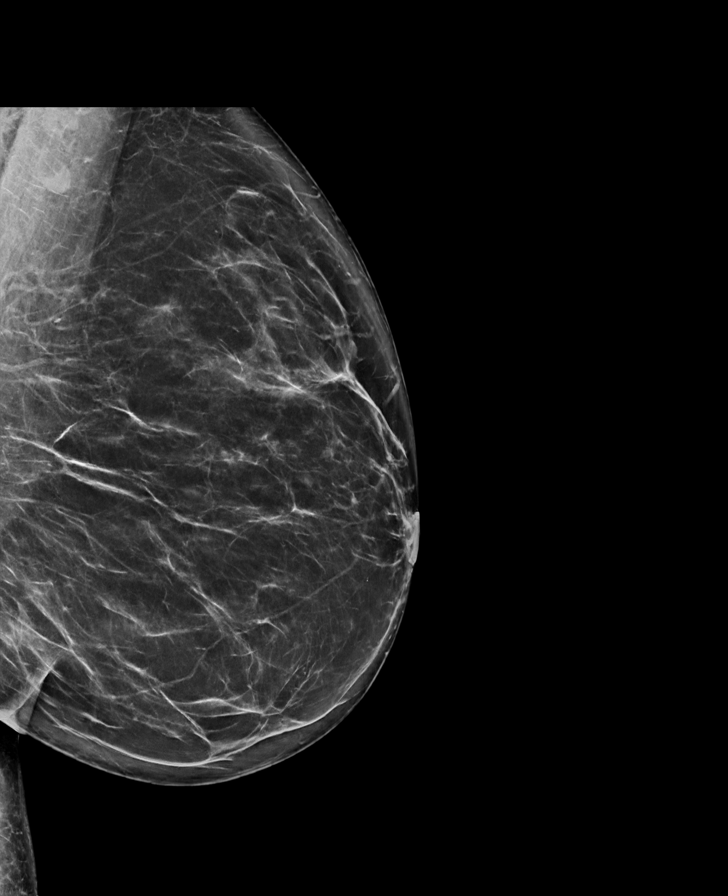

[R CC synth-2D]
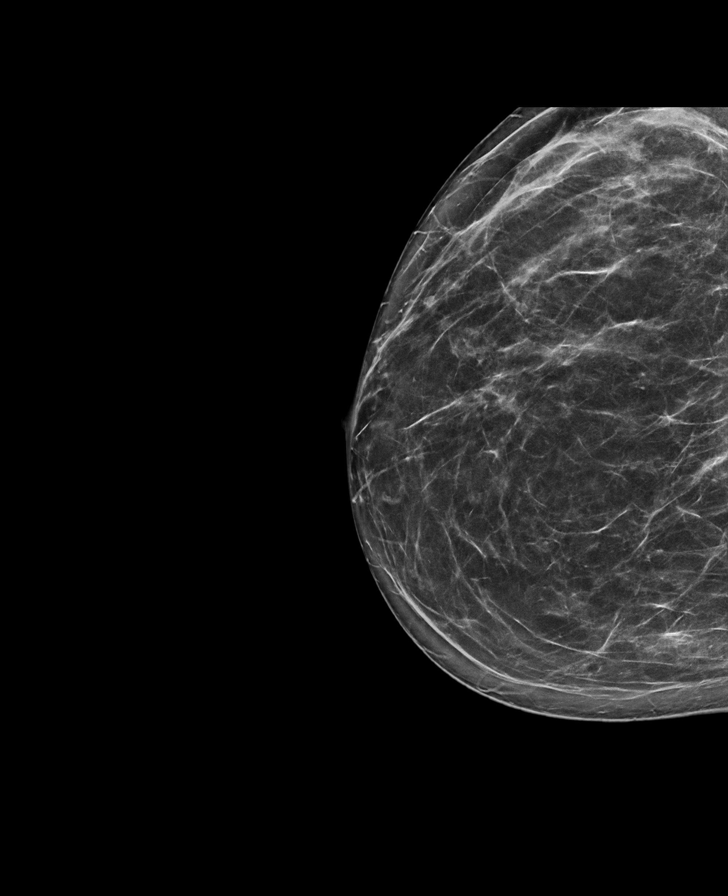

[L CC synth-2D]
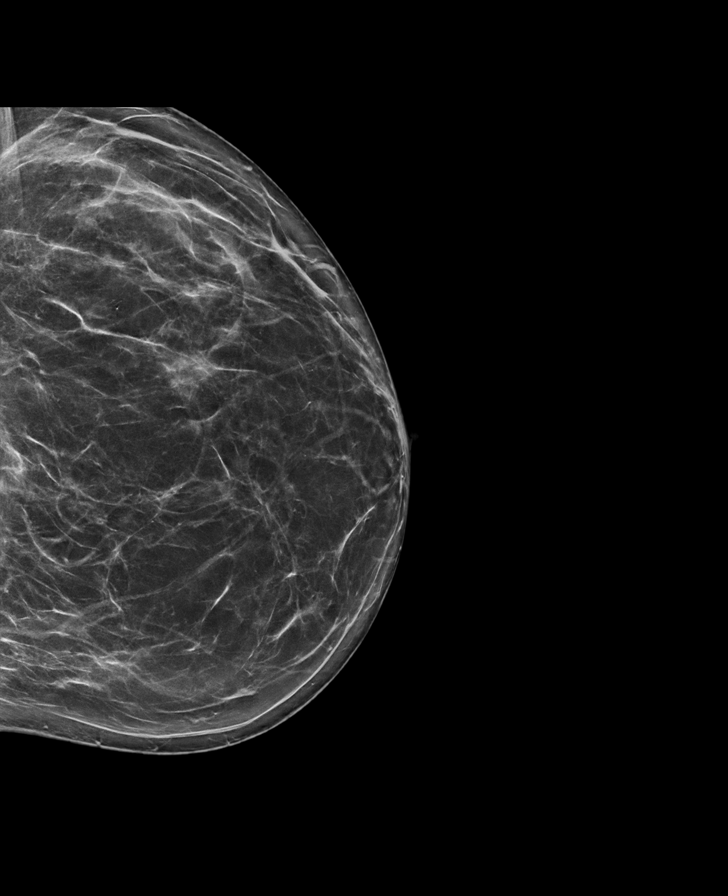

[R MLO synth-2D]
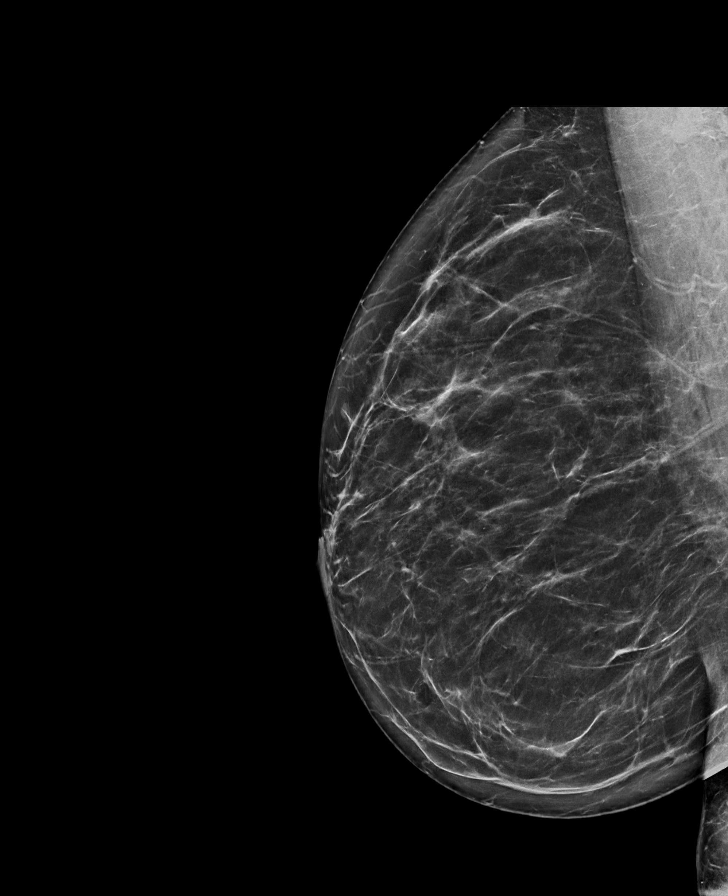

[L MLO tomo · tomo slice 45/90.0]
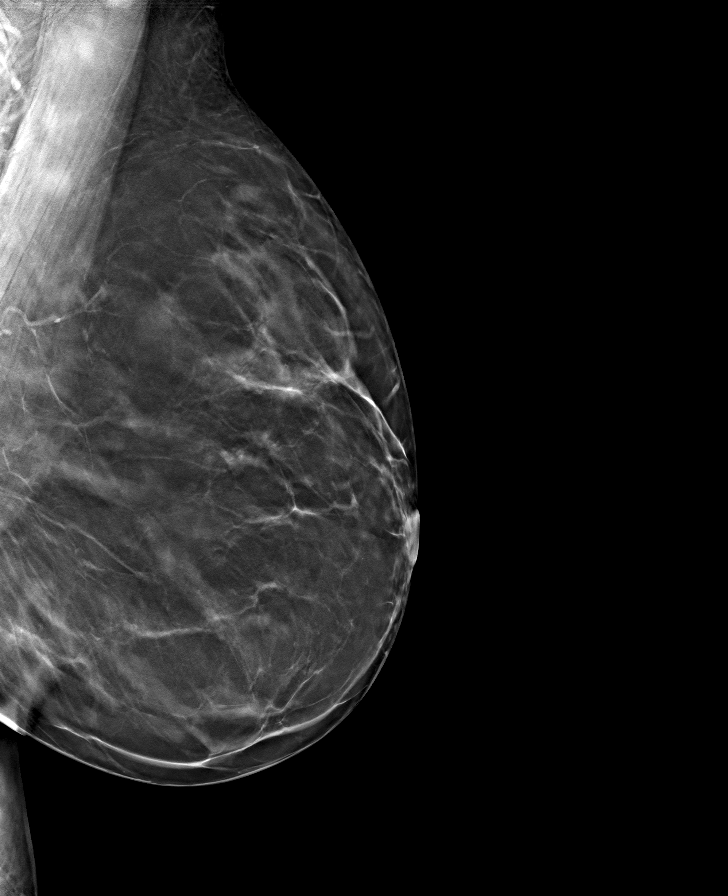

[L CC tomo · tomo slice 43/84.0]
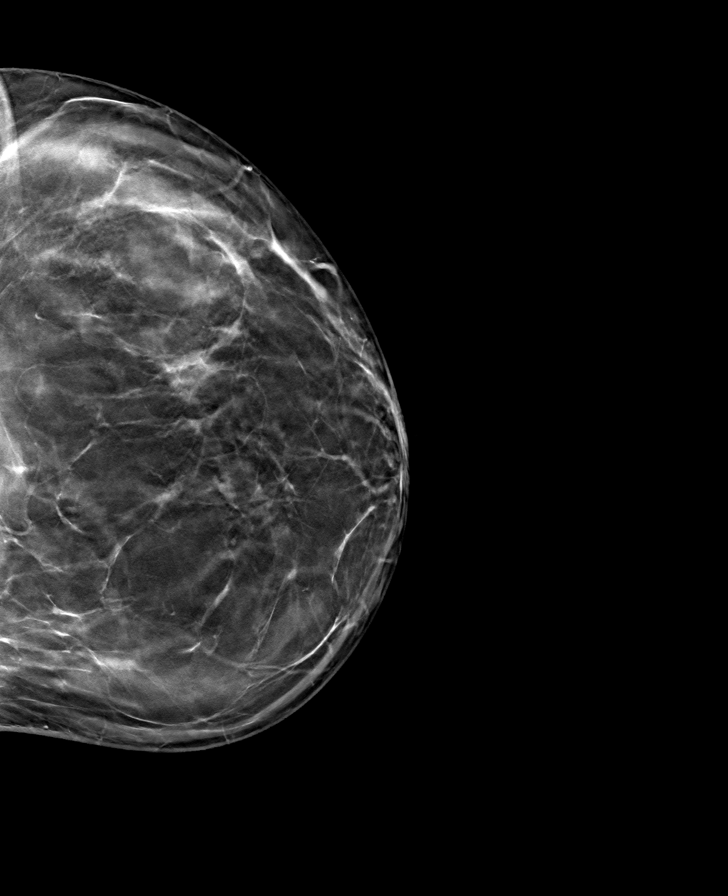

[R MLO tomo · tomo slice 43/85.0]
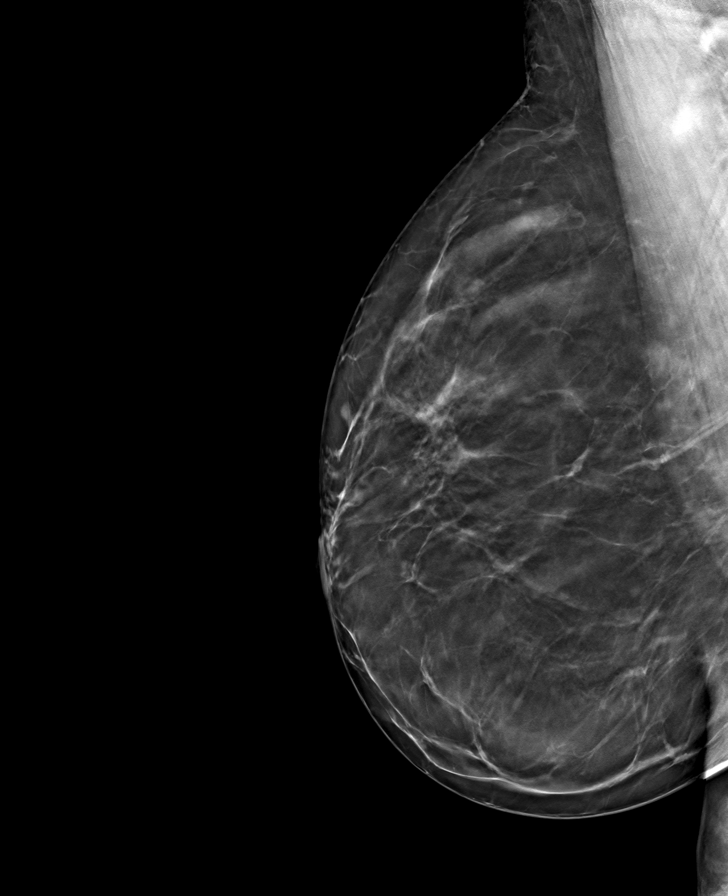

[R CC tomo · tomo slice 40/79.0]
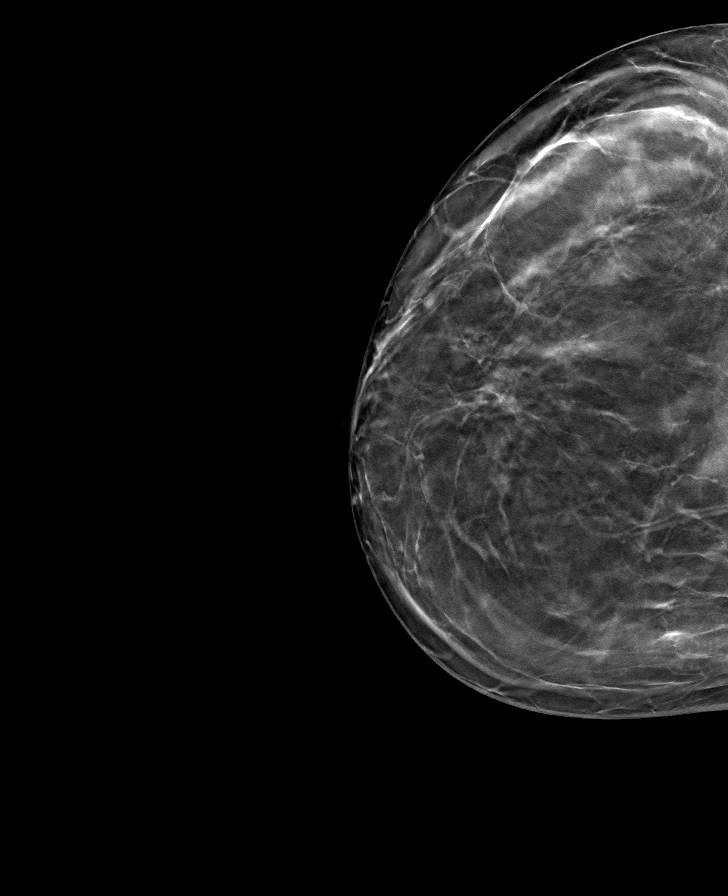

[8 of 24 positions shown; findings below may reference images not displayed]

ACR Breast Density Category b: There are scattered areas of
fibroglandular density.
FINDINGS: There are no findings suspicious for malignancy.
IMPRESSION: No mammographic evidence of malignancy. A result letter of this
screening mammogram will be mailed directly to the patient.

RECOMMENDATION:
Screening mammogram in one year. (Code:[BY])

BI-RADS CATEGORY  1: Negative.

## 2021-11-14 ENCOUNTER — Encounter: Payer: Self-pay | Admitting: Internal Medicine

## 2021-11-14 ENCOUNTER — Ambulatory Visit (INDEPENDENT_AMBULATORY_CARE_PROVIDER_SITE_OTHER): Payer: Medicaid Other | Admitting: Internal Medicine

## 2021-11-14 VITALS — BP 130/90 | HR 91 | Ht 69.0 in | Wt 196.0 lb

## 2021-11-14 DIAGNOSIS — R1031 Right lower quadrant pain: Secondary | ICD-10-CM | POA: Diagnosis not present

## 2021-11-14 NOTE — Progress Notes (Signed)
Chief Complaint: Liver lesion  HPI : 46 year old female with history of HTN presents with liver lesion.  Endorses feeling abdominal pressure. Endorses RLQ ab pain. This pain is described as sharp. Has not been on her menstrual period and has still had issues with this pain. Denies N&V. She has recently been to the ED on 10/28/21 for RLQ ab pain. At that time she was discussed with Dr. Rosendo Gros who did not feel that she needed surgery at that time. She is arranged for OB/GYN follow up in late 12/2021. Denies melena, hematochezia, diarrhea, constipation. Denies fam hx of colon cancer.  She has never had an EGD or colonoscopy in the past.  She was recently found to have a liver lesion based upon the CT that was done in the ED.  Past Medical History:  Diagnosis Date   Hypertension     Past Surgical History:  Procedure Laterality Date   CESAREAN SECTION     Family History  Problem Relation Age of Onset   Liver disease Mother    Liver cancer Maternal Aunt    Prostate cancer Maternal Grandfather    Breast cancer Neg Hx    Social History   Tobacco Use   Smoking status: Former    Types: Cigarettes   Smokeless tobacco: Never  Substance Use Topics   Alcohol use: Not Currently   Drug use: Not Currently   Current Outpatient Medications  Medication Sig Dispense Refill   naproxen (NAPROSYN) 500 MG tablet Take 1 tablet (500 mg total) by mouth 2 (two) times daily as needed for moderate pain. 15 tablet 0   ondansetron (ZOFRAN ODT) 4 MG disintegrating tablet Take 1 tablet (4 mg total) by mouth every 8 (eight) hours as needed for nausea or vomiting. 5 tablet 0   hydrochlorothiazide (MICROZIDE) 12.5 MG capsule Take 12.5 mg by mouth daily. (Patient not taking: Reported on 05/28/2021)     No current facility-administered medications for this visit.   No Known Allergies   Review of Systems: All systems reviewed and negative except where noted in HPI.   Physical Exam: BP 130/90    Pulse 91     Ht 5\' 9"  (1.753 m)    Wt 196 lb (88.9 kg)    LMP 10/30/2021    BMI 28.94 kg/m  Constitutional: Pleasant,well-developed, female in no acute distress. HEENT: Normocephalic and atraumatic. Conjunctivae are normal. No scleral icterus. Cardiovascular: Normal rate, regular rhythm.  Pulmonary/chest: Effort normal and breath sounds normal. No wheezing, rales or rhonchi. Abdominal: Soft, nondistended, tender in the RLQ. Bowel sounds active throughout. Extremities: No edema Neurological: Alert and oriented to person place and time. Skin: Skin is warm and dry. No rashes noted. Psychiatric: Normal mood and affect. Behavior is normal.  Labs 10/2021: CBC nml, CMP unremarkable, lipase normal  CT A/P w/contrast 10/28/21: IMPRESSION: 1. The appendix is thickened measuring up to 12 mm without significant inflammatory changes. The distal aspect of the appendix is abnormal in appearance containing low-attenuation structures. Additionally, there is amorphous low attenuation fluid extending from this region along the superior and right lateral aspect of the uterus. Given the lack of inflammatory change, this is likely not representative of acute appendicitis however the distal aspect of the appendix does appear abnormal and may potentially represent a mucocele. The origin of the amorphous low-attenuation material along the superior and right lateral aspect of the uterus is unclear however could potentially be secondary to a ruptured mucocele off the tip of the appendix  or potentially secondary to patient's reported history of endometriosis. There are adjacent cysts within the right ovary which is located in this region as well. Given the abnormal appearance, at a minimum, a short-term follow-up CT or MRI would be recommended. Recommend surgical consultation given the possibility of an appendix mucocele, potentially ruptured. 2. There is a low-attenuation lesion within the left hepatic lobe which potentially  represents hemangioma. There are 2 additional hyperenhancing lesions within the liver. Recommend dedicated evaluation these lesions in the nonacute setting with abdominal MRI for definitive characterization.   ASSESSMENT AND PLAN: RLQ ab pain Hepatic lesion, possibly hemangioma Possible ruptured appendiceal mucocele vs endometriosis At this time unclear exactly what is causing the patient's right lower quadrant abdominal pain.  However, based upon her last CT scan, would be concerned about a potentially ruptured appendiceal mucocele, or appendicitis.  On that same CT scan, she was noted to have a liver lesion, favored to be a hemangioma.  Will plan for MRI abdomen for further evaluation of both the liver lesion and right lower quadrant abdominal pain, which is focally tender on exam.  Will go ahead and refer to surgery for consideration of appendix removal.  If MRI does not show any concerning findings, then would consider colonoscopy for further evaluation. - MRI abdomen - Urgent referral to surgery  Christia Reading, MD

## 2021-11-14 NOTE — Patient Instructions (Signed)
If you are age 46 or older, your body mass index should be between 23-30. Your Body mass index is 28.94 kg/m. If this is out of the aforementioned range listed, please consider follow up with your Primary Care Provider.  If you are age 60 or younger, your body mass index should be between 19-25. Your Body mass index is 28.94 kg/m. If this is out of the aformentioned range listed, please consider follow up with your Primary Care Provider.   ________________________________________________________  The [facility] providers would like to encourage you to use [facility] to communicate with providers for non-urgent requests or questions.  Due to long hold times on the telephone, sending your provider a message by [facility] may be a faster and more efficient way to get a response.  Please allow 48 business hours for a response.  Please remember that this is for non-urgent requests.  _______________________________________________________  Jenna Heath have been scheduled for an MRI at [facility] on 11/17/2021. Your appointment time is 9:00pm. Please arrive to admitting (at main entrance of the hospital) 30 minutes prior to your appointment time for registration purposes. Please make certain not to have anything to eat or drink 4 hours prior to your test. In addition, if you have any metal in your body, have a pacemaker or defibrillator, please be sure to let your ordering physician know. This test typically takes 45 minutes to 1 hour to complete. Should you need to reschedule, please call [phone] to do so.  You have been referred to [facility].  They will call you to schedule an appointment.

## 2021-11-17 ENCOUNTER — Other Ambulatory Visit: Payer: Self-pay

## 2021-11-17 ENCOUNTER — Encounter (HOSPITAL_COMMUNITY): Payer: Self-pay

## 2021-11-17 ENCOUNTER — Ambulatory Visit (HOSPITAL_COMMUNITY)
Admission: RE | Admit: 2021-11-17 | Discharge: 2021-11-17 | Disposition: A | Discharge status: Home / Self Care | Payer: Medicaid Other | Source: Ambulatory Visit | Attending: Internal Medicine | Admitting: Internal Medicine

## 2021-11-17 DIAGNOSIS — R1031 Right lower quadrant pain: Secondary | ICD-10-CM

## 2021-11-27 ENCOUNTER — Telehealth: Payer: Self-pay | Admitting: Internal Medicine

## 2021-11-27 NOTE — Telephone Encounter (Signed)
Patient called stating she was supposed to have an MRI of the Abd/Pelvis last week, but she could not do it.  She said, "I was scared to death to go into that tube."  She wants to know if there is another alternative to finding out what's wrong with her other than doing an MRI or "putting her in a tube."  Please call patient and advise.  Thank you.

## 2021-11-28 ENCOUNTER — Other Ambulatory Visit: Payer: Self-pay

## 2021-11-28 DIAGNOSIS — R1031 Right lower quadrant pain: Secondary | ICD-10-CM

## 2021-11-28 DIAGNOSIS — F411 Generalized anxiety disorder: Secondary | ICD-10-CM

## 2021-11-28 MED ORDER — DIAZEPAM 5 MG PO TABS: ORAL_TABLET | ORAL | 0 refills | Status: DC

## 2021-11-28 NOTE — Telephone Encounter (Signed)
Following message received from Dr. Lorenso Courier:  Faythe Ghee we can give her Valium 5 mg x 2 tablets. She can take one tablet 1 hour before her MRI procedure, and another one as needed. Please ensure that she has someone drive her to and from the MRI.   Orders placed for Valium and faxed to pt preferred pharmacy. Confirmation received.

## 2021-12-04 ENCOUNTER — Other Ambulatory Visit: Payer: Self-pay

## 2021-12-04 ENCOUNTER — Ambulatory Visit (HOSPITAL_COMMUNITY)
Admission: RE | Admit: 2021-12-04 | Discharge: 2021-12-04 | Disposition: A | Discharge status: Home / Self Care | Payer: Medicaid Other | Source: Ambulatory Visit | Attending: Internal Medicine | Admitting: Internal Medicine

## 2021-12-04 DIAGNOSIS — R1031 Right lower quadrant pain: Secondary | ICD-10-CM | POA: Diagnosis not present

## 2021-12-04 IMAGING — MR MR PELVIS WO/W CM
14 of 16 series · 41 of 48 positions shown · IV contrast (gadavist)
Comparison: CT abdomen pelvis, [DATE]

CLINICAL DATA: Right lower quadrant abdominal pain, abnormal
appendix on prior CT, incidental liver lesion, history of
endometriosis

EXAM:
MRI ABDOMEN AND PELVIS WITHOUT AND WITH CONTRAST
TECHNIQUE: Multiplanar multisequence MR imaging of the abdomen and pelvis was
performed both before and after the administration of intravenous
contrast.
CONTRAST:  9mL GADAVIST GADOBUTROL 1 MMOL/ML IV SOLN

[Series 4: T2 · coronal · 6.0mm · 1.56mm/px · 2 of 30 slices shown (1 of 4)]
[im 1/30]
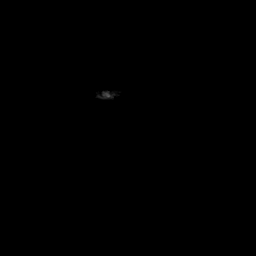
[im 30/30]
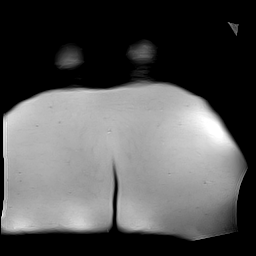

[Series 5: T2 · axial · 5.0mm · 0.51mm/px · z∈[-12,+202]mm · 2 of 34 slices shown (2 of 4)]
[im 1/34]
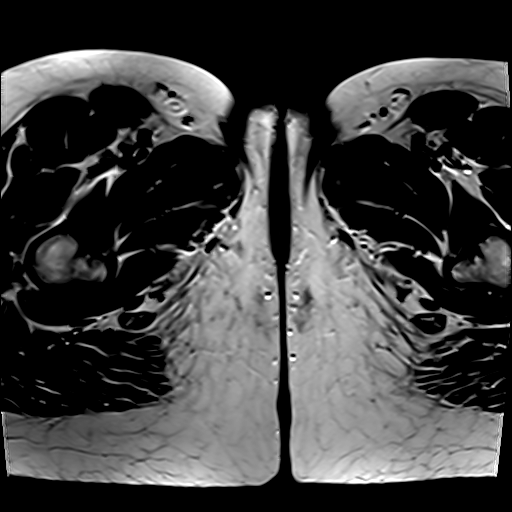
[im 34/34]
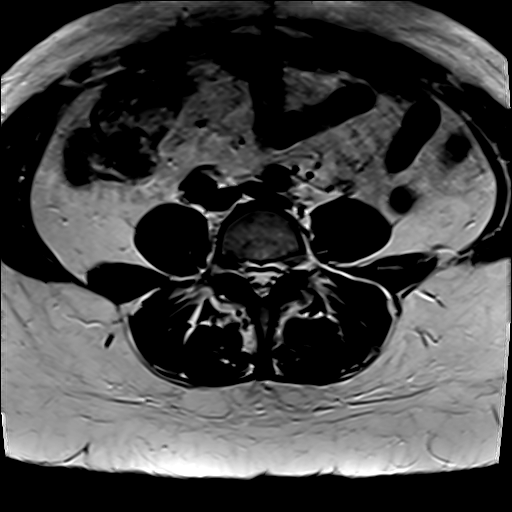

[Series 6: T2 fat-sat · axial · 5.0mm · 0.51mm/px · z∈[-12,+202]mm · 2 of 34 slices shown]
[im 1/34]
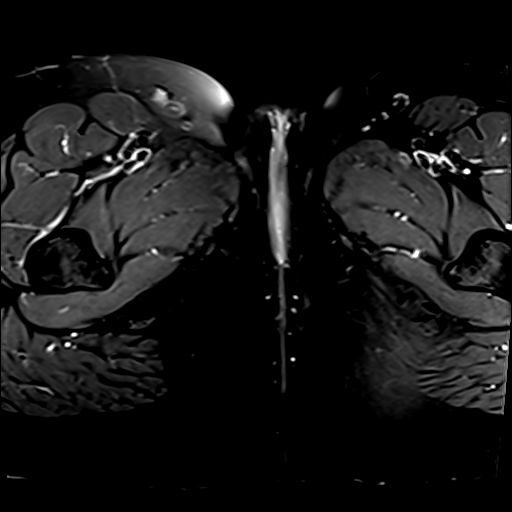
[im 34/34]
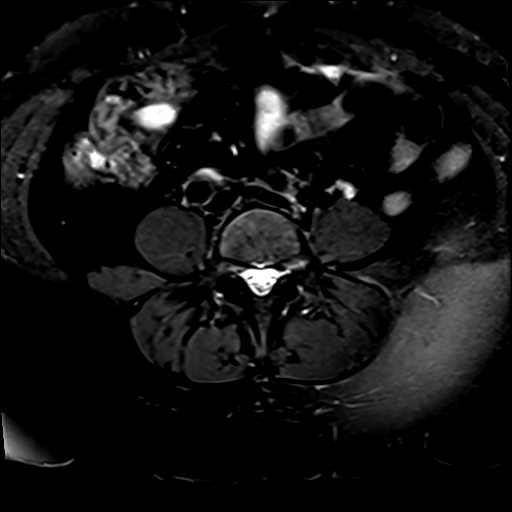

[Series 7: T2 · coronal · 4.5mm · 0.55mm/px · 2 of 34 slices shown (3 of 4)]
[im 1/34]
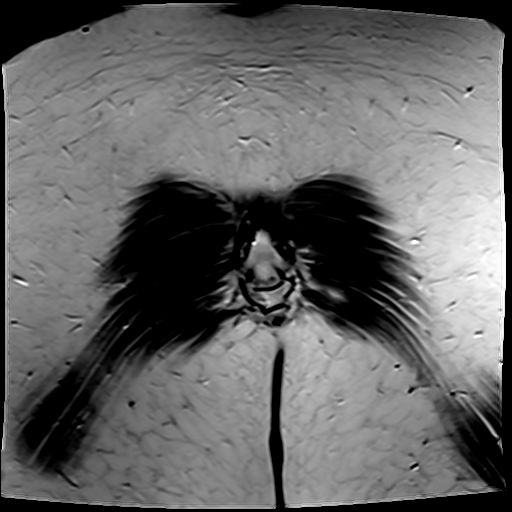
[im 34/34]
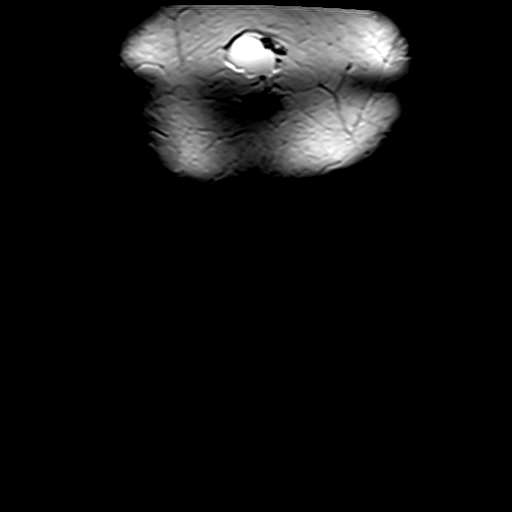

[Series 8: T2 · sagittal · 5.0mm · 0.55mm/px · 2 of 40 slices shown (4 of 4)]
[im 1/40]
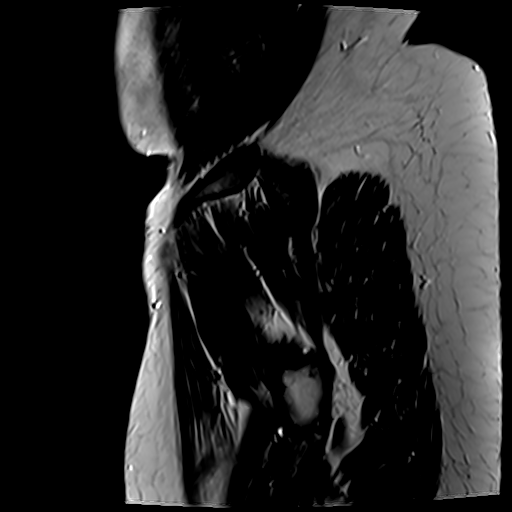
[im 40/40]
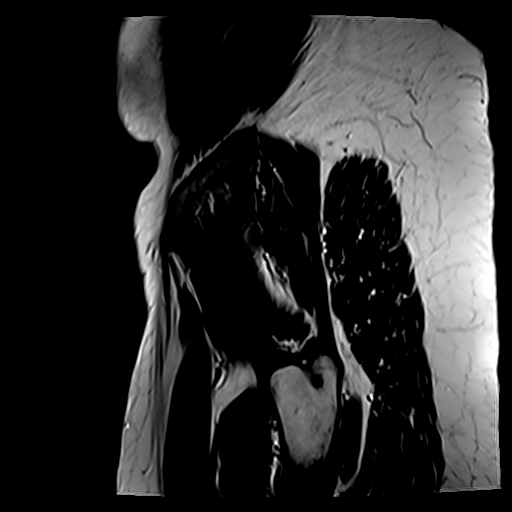

[Series 9: T1 · axial · 4.0mm · 0.88mm/px · z∈[-43,+241]mm · 4 of 72 slices shown (1 of 2)]
[im 1/72]
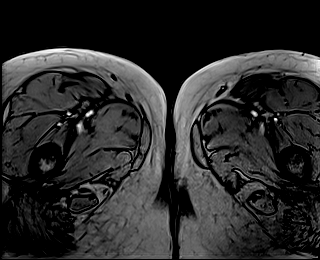
[im 24/72]
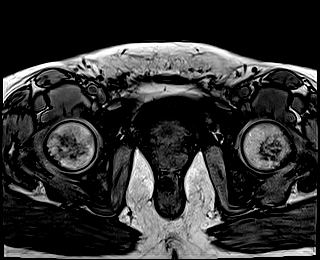
[im 48/72]
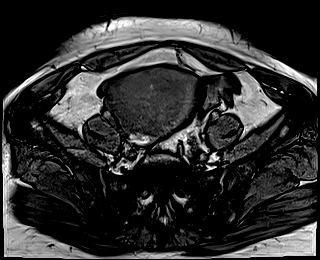
[im 72/72]
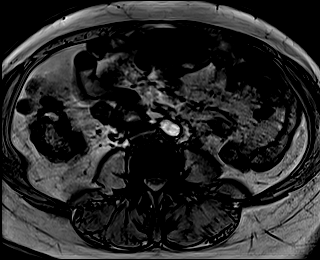

[Series 10: T1 · axial · 4.0mm · 0.88mm/px · z∈[-43,+241]mm · 4 of 72 slices shown (2 of 2)]
[im 1/72]
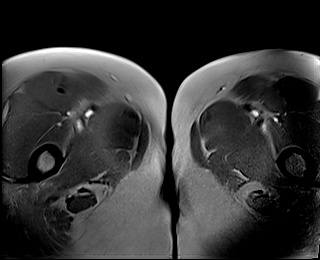
[im 24/72]
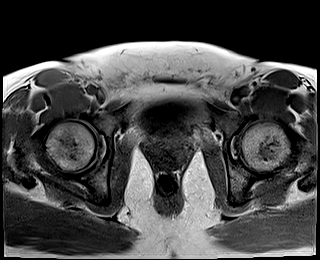
[im 48/72]
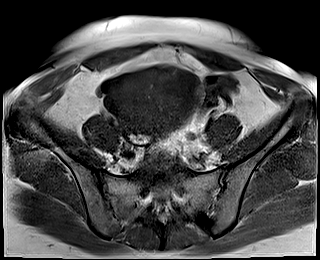
[im 72/72]
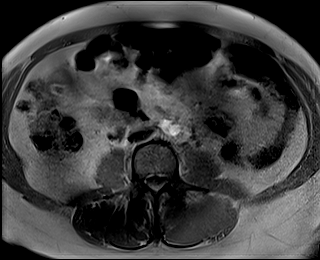

[Series 11: DWI · axial · 5.5mm · 2.80mm/px · z∈[-28,+195]mm · 5 of 86 slices shown (1 of 3)]
[im 1/86]
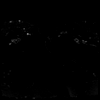
[im 22/86]
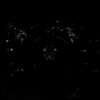
[im 43/86]
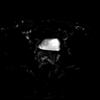
[im 64/86]
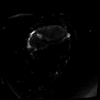
[im 86/86]
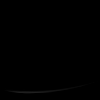

[Series 12: DWI · axial · 5.5mm · 2.80mm/px · z∈[-28,+195]mm · 2 of 28 slices shown (2 of 3)]
[im 1/28]
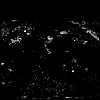
[im 28/28]
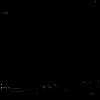

[Series 13: DWI · axial · 5.5mm · 2.80mm/px · z∈[-28,+195]mm · 2 of 30 slices shown (3 of 3)]
[im 1/30]
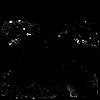
[im 30/30]
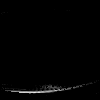

[Series 15: T1 dynamic · axial · 3.0mm · 0.94mm/px · z∈[-9,+228]mm · 4 of 80 slices shown]
[im 1/80]
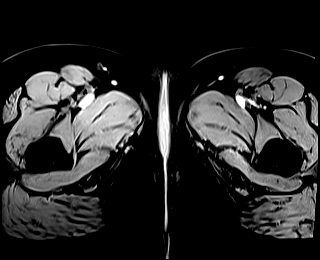
[im 27/80]
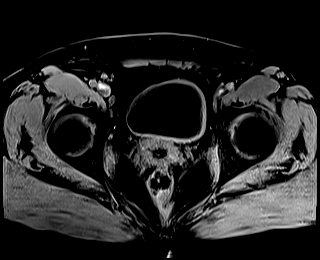
[im 53/80]
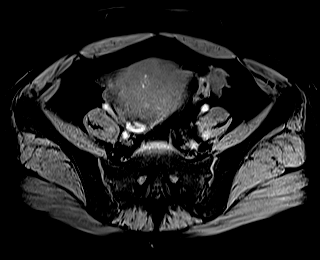
[im 80/80]
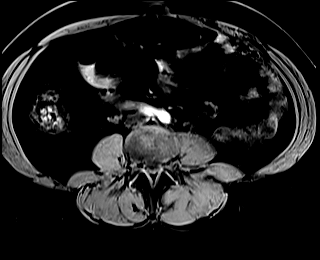

[Series 18: T1 dynamic post-contrast · axial · 3.0mm · 0.94mm/px · z∈[-9,+228]mm · 4 of 80 slices shown (1 of 3)]
[im 1/80]
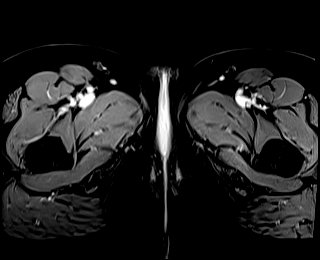
[im 27/80]
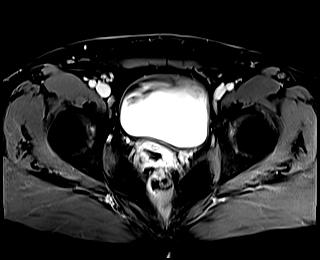
[im 53/80]
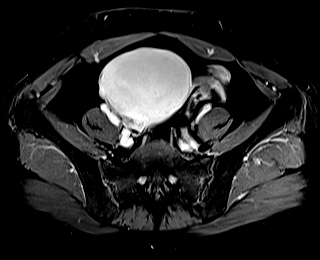
[im 80/80]
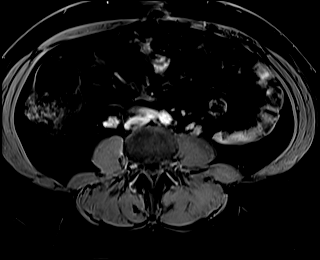

[Series 19: T1 dynamic post-contrast · axial · 3.0mm · 0.94mm/px · z∈[-9,+228]mm · 4 of 80 slices shown (2 of 3)]
[im 1/80]
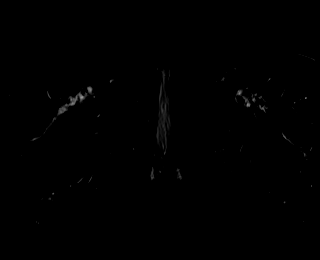
[im 27/80]
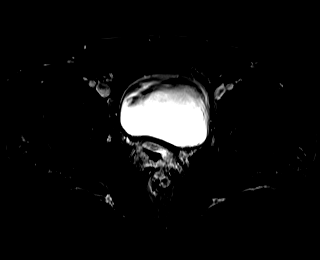
[im 53/80]
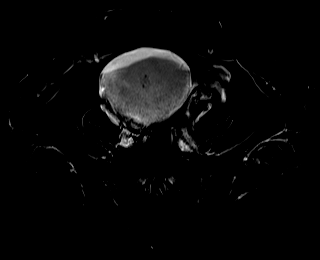
[im 80/80]
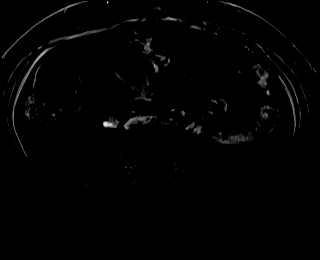

[Series 21: T1 dynamic post-contrast · sagittal · 3.0mm · 0.88mm/px · 2 of 80 slices shown (3 of 3)]
[im 1/80]
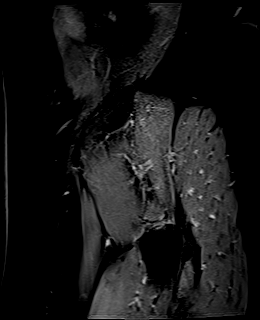
[im 27/80]
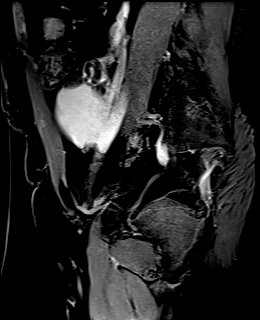

[41 of 48 positions shown; findings below may reference images not displayed]

FINDINGS: COMBINED FINDINGS FOR BOTH MR ABDOMEN AND PELVIS

Lower chest: No acute findings.

Hepatobiliary: Definitively benign hemangioma of the anterior left
lobe of the liver, hepatic segment III, demonstrating discontinuous
peripheral nodular contrast enhancement and measuring 2.2 x 1.8 cm
(series 28, image 33). Additional small definitively benign
hemangiomata of the posteroinferior right lobe of the liver, hepatic
segment VI, measuring 1.7 x 1.6 cm (series 19, image 51) and 1.1 x
1.1 cm (series 19, image 43). No solid mass or other parenchymal
abnormality identified. No gallstones. No biliary ductal dilatation.

Pancreas: No mass, inflammatory changes, or other parenchymal
abnormality identified.No pancreatic ductal dilatation.

Spleen:  Within normal limits in size and appearance.

Adrenals/Urinary Tract: Normal adrenal glands. No solid renal masses
or suspicious contrast enhancement identified. No evidence of
hydronephrosis.

Stomach/Bowel: The appendix is again noted to be mildly thickened,
approximately 1.2 cm in caliber. The tip of the appendix is closely
adjacent to the uterine fundus and fluid collection described below
and the tip of the appendix appears to be fluid-filled (series 7,
image 21). The bowel is otherwise unremarkable.

Vascular/Lymphatic: No pathologically enlarged lymph nodes
identified. No abdominal aortic aneurysm demonstrated.

Reproductive: Uterine adenomyosis and at least one fibroid of the
uterine fundus (series 8, image 18). Multiple small right ovarian
cysts (series 5, image 7). The ovary appears to be adherent to the
uterine fundus and there is an associated lobulated fluid collection
about the uterine fundus measuring approximately 7.3 x 5.3 x 1.6 cm
(series 5, image 8, series 8, image 18). At least some components of
this collection are intrinsically T1 hyperintense. Normal left ovary
with subcentimeter follicles. Nabothian cysts of the cervix.

Other: Small epigastric hernia just above the umbilicus containing
layering hemorrhagic or proteinaceous fluid, measuring 2.8 x 2.4 cm
(series 8, image 33).

Musculoskeletal: No suspicious osseous lesions identified.
Incidental small vertebral body hemangioma of the inferior endplate
of L2 (series 30, image 50).
IMPRESSION: 1. The appendix is again noted to be mildly thickened, approximately
1.2 cm in caliber. The tip of the appendix is closely adjacent to
the uterine fundus, as is the right ovary, and there is an
associated lobulated fluid collection about the uterine fundus
measuring approximately 7.3 x 5.3 x 1.6 cm. At least some components
of this collection are intrinsically T1 hyperintense, suggesting
hemorrhagic products. Given history of endometriosis, generally
favor an endometrioma involving the tip of the appendix and right
ovary, although ruptured appendiceal mucocele does remain a
differential consideration.
2. Small epigastric hernia just above the umbilicus containing
layering hemorrhagic or proteinaceous fluid, consistent with an
endometrioma.
3. Uterine adenomyosis with at least one fibroid of the uterine
fundus.
4. Multiple, definitively benign hepatic hemangiomata, largest
measuring 2.2 x 1.8 cm and seen incidentally on prior CT.
5. No acute findings in the abdomen or pelvis.

## 2021-12-04 IMAGING — MR MR ABDOMEN WO/W CM
19 series · 48 of 48 positions shown · IV contrast (gadavist)
Comparison: CT abdomen pelvis, [DATE]

CLINICAL DATA: Right lower quadrant abdominal pain, abnormal
appendix on prior CT, incidental liver lesion, history of
endometriosis

EXAM:
MRI ABDOMEN AND PELVIS WITHOUT AND WITH CONTRAST
TECHNIQUE: Multiplanar multisequence MR imaging of the abdomen and pelvis was
performed both before and after the administration of intravenous
contrast.
CONTRAST:  9mL GADAVIST GADOBUTROL 1 MMOL/ML IV SOLN

[Series 5: T2 · coronal · 6.0mm · 1.56mm/px · 2 of 30 slices shown (1 of 2)]
[im 1/30]
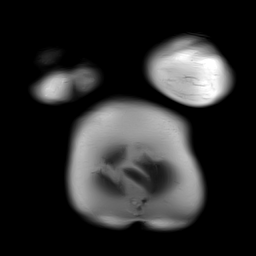
[im 30/30]
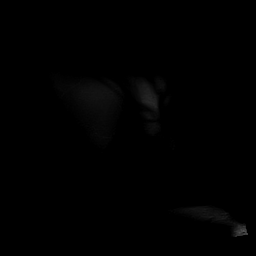

[Series 6: T2 fat-sat · axial · 6.0mm · 1.48mm/px · z∈[+137,+482]mm · 2 of 42 slices shown (1 of 2)]
[im 1/42]
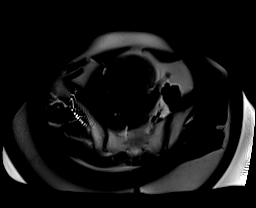
[im 42/42]
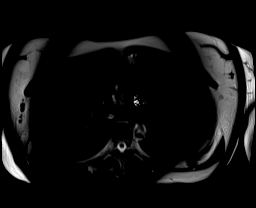

[Series 8: T2 fat-sat · axial · 6.0mm · 1.19mm/px · z∈[+153,+481]mm · 2 of 40 slices shown (2 of 2)]
[im 1/40]
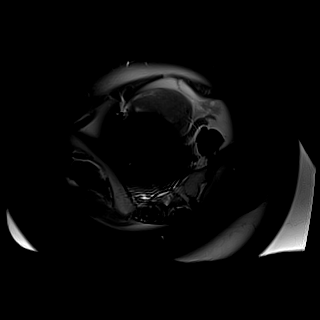
[im 40/40]
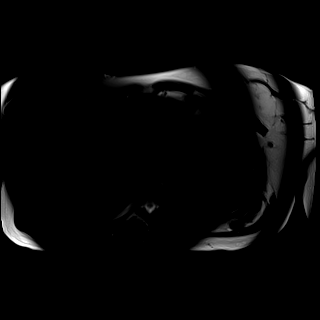

[Series 9: T1 · axial · 5.0mm · 1.19mm/px · z∈[+129,+484]mm · 3 of 72 slices shown (1 of 2)]
[im 1/72]
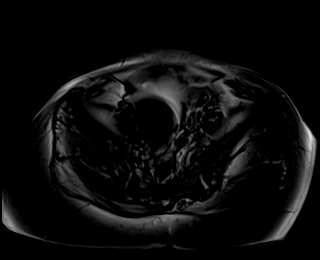
[im 36/72]
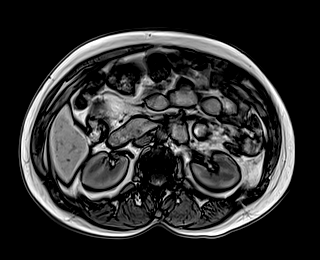
[im 72/72]
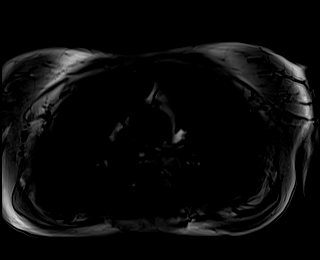

[Series 10: T1 · axial · 5.0mm · 1.19mm/px · z∈[+129,+484]mm · 3 of 72 slices shown (2 of 2)]
[im 1/72]
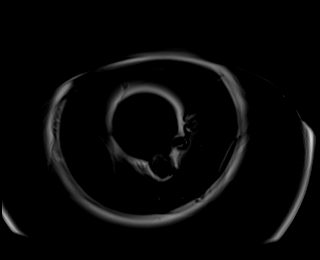
[im 36/72]
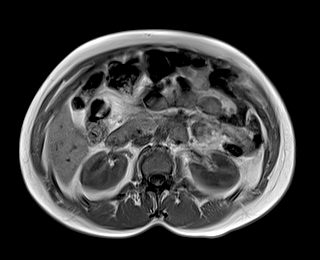
[im 72/72]
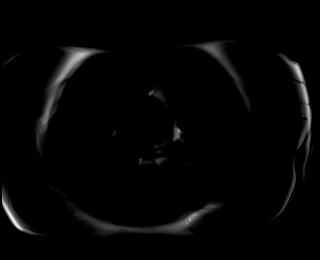

[Series 11: DWI · axial · 7.0mm · 1.42mm/px · z∈[+133,+476]mm · 3 of 72 slices shown (1 of 2)]
[im 1/72]
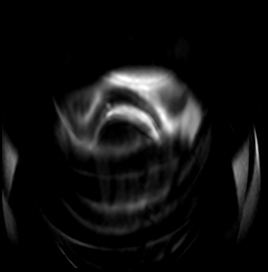
[im 36/72]
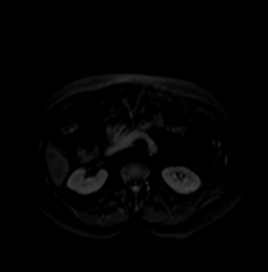
[im 72/72]
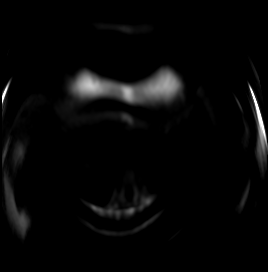

[Series 12: DWI · axial · 7.0mm · 1.42mm/px · 1 of 36 slices shown (2 of 2)]
[im 1/36]
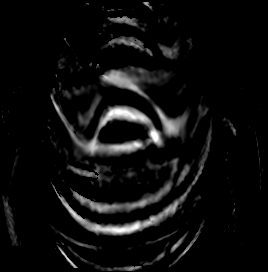

[Series 13: bSSFP · axial · 5.5mm · 0.80mm/px · 1 of 45 slices shown]
[im 1/45]
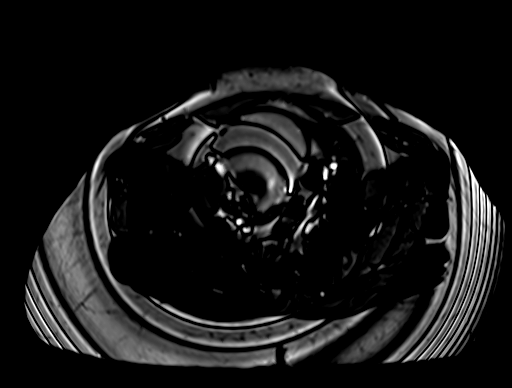

[Series 15: T1 dynamic · axial · 3.2mm · 1.19mm/px · z∈[+143,+473]mm · 3 of 104 slices shown (1 of 10)]
[im 1/104]
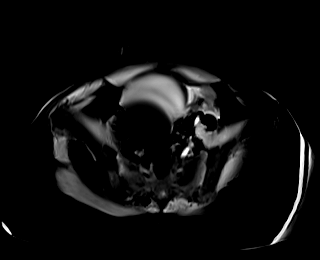
[im 52/104]
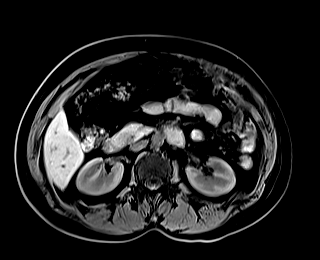
[im 104/104]
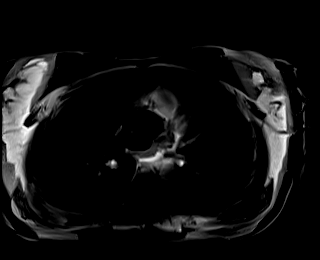

[Series 19: T1 dynamic · axial · 3.2mm · 1.19mm/px · z∈[+143,+473]mm · 3 of 104 slices shown (2 of 10)]
[im 1/104]
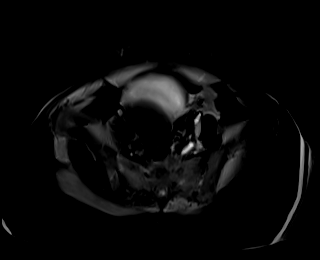
[im 52/104]
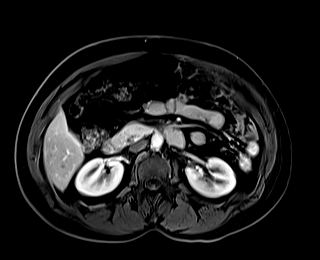
[im 104/104]
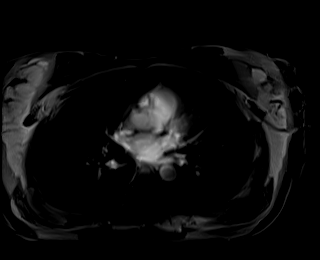

[Series 20: T1 dynamic · axial · 3.2mm · 1.19mm/px · z∈[+143,+473]mm · 3 of 104 slices shown (3 of 10)]
[im 1/104]
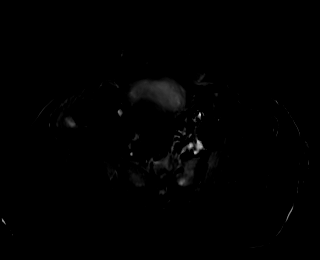
[im 52/104]
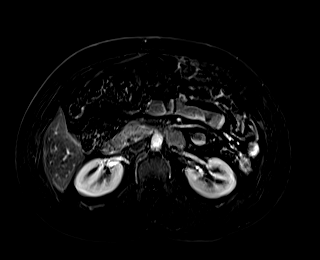
[im 104/104]
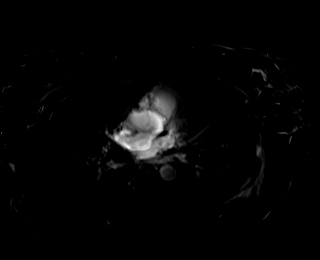

[Series 23: T1 dynamic · axial · 3.2mm · 1.19mm/px · z∈[+143,+473]mm · 3 of 104 slices shown (4 of 10)]
[im 1/104]
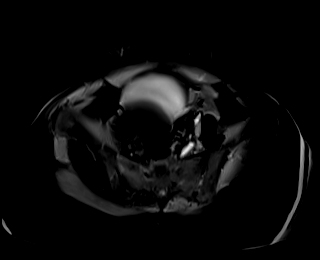
[im 52/104]
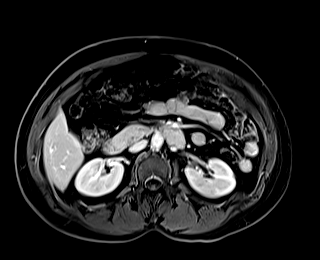
[im 104/104]
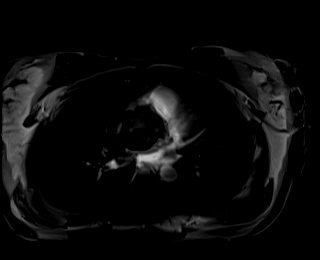

[Series 24: T1 dynamic · axial · 3.2mm · 1.19mm/px · z∈[+143,+473]mm · 3 of 104 slices shown (5 of 10)]
[im 1/104]
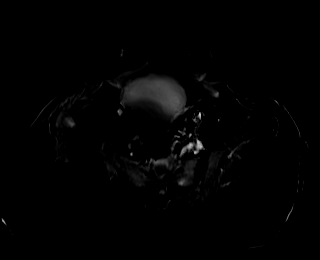
[im 52/104]
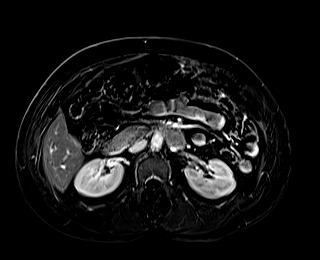
[im 104/104]
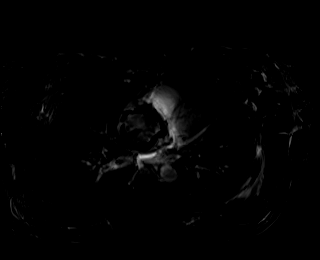

[Series 27: T1 dynamic · axial · 3.2mm · 1.19mm/px · z∈[+143,+473]mm · 3 of 104 slices shown (6 of 10)]
[im 1/104]
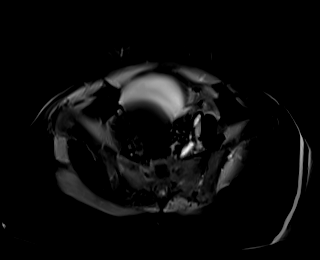
[im 52/104]
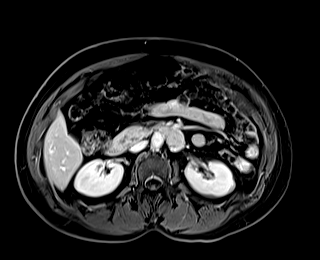
[im 104/104]
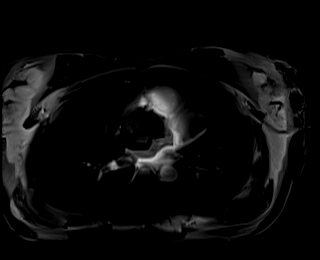

[Series 28: T1 dynamic · axial · 3.2mm · 1.19mm/px · z∈[+143,+473]mm · 3 of 104 slices shown (7 of 10)]
[im 1/104]
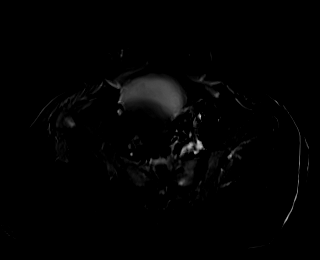
[im 52/104]
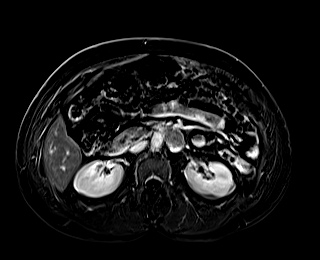
[im 104/104]
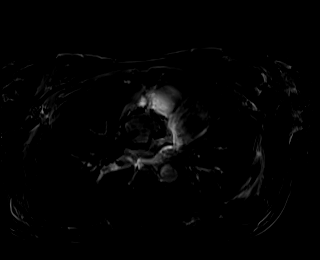

[Series 30: T1 dynamic · coronal · 3.5mm · 1.41mm/px · 3 of 80 slices shown (8 of 10)]
[im 1/80]
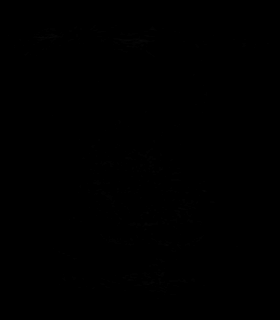
[im 40/80]
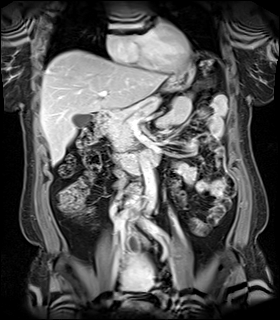
[im 80/80]
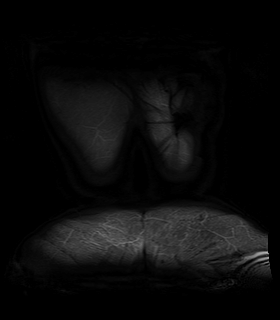

[Series 31: T2 · axial · 7.0mm · 1.48mm/px · 1 of 36 slices shown (2 of 2)]
[im 1/36]
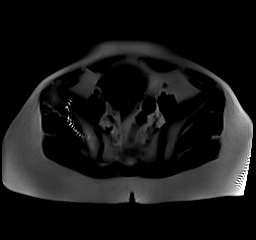

[Series 34: T1 dynamic · axial · 3.2mm · 1.19mm/px · z∈[+143,+473]mm · 3 of 104 slices shown (9 of 10)]
[im 1/104]
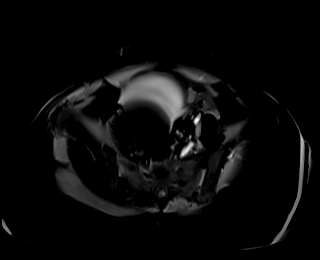
[im 52/104]
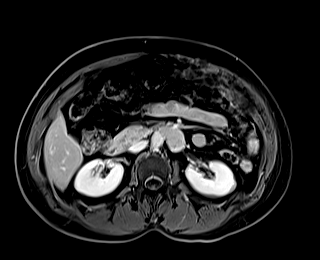
[im 104/104]
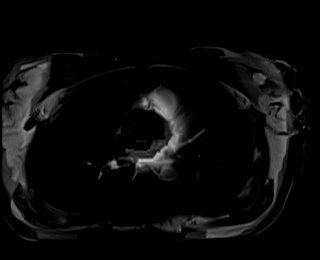

[Series 35: T1 dynamic · axial · 3.2mm · 1.19mm/px · z∈[+143,+473]mm · 3 of 104 slices shown (10 of 10)]
[im 1/104]
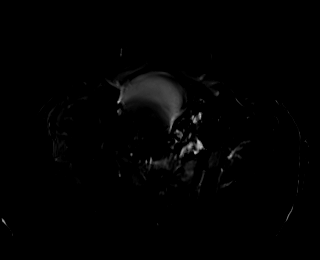
[im 52/104]
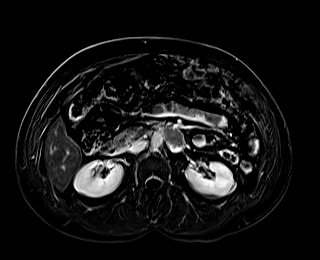
[im 104/104]
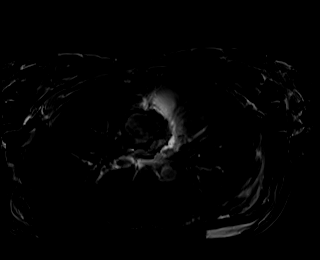

[48 of 48 positions shown; findings below may reference images not displayed]

FINDINGS: COMBINED FINDINGS FOR BOTH MR ABDOMEN AND PELVIS

Lower chest: No acute findings.

Hepatobiliary: Definitively benign hemangioma of the anterior left
lobe of the liver, hepatic segment III, demonstrating discontinuous
peripheral nodular contrast enhancement and measuring 2.2 x 1.8 cm
(series 28, image 33). Additional small definitively benign
hemangiomata of the posteroinferior right lobe of the liver, hepatic
segment VI, measuring 1.7 x 1.6 cm (series 19, image 51) and 1.1 x
1.1 cm (series 19, image 43). No solid mass or other parenchymal
abnormality identified. No gallstones. No biliary ductal dilatation.

Pancreas: No mass, inflammatory changes, or other parenchymal
abnormality identified.No pancreatic ductal dilatation.

Spleen:  Within normal limits in size and appearance.

Adrenals/Urinary Tract: Normal adrenal glands. No solid renal masses
or suspicious contrast enhancement identified. No evidence of
hydronephrosis.

Stomach/Bowel: The appendix is again noted to be mildly thickened,
approximately 1.2 cm in caliber. The tip of the appendix is closely
adjacent to the uterine fundus and fluid collection described below
and the tip of the appendix appears to be fluid-filled (series 7,
image 21). The bowel is otherwise unremarkable.

Vascular/Lymphatic: No pathologically enlarged lymph nodes
identified. No abdominal aortic aneurysm demonstrated.

Reproductive: Uterine adenomyosis and at least one fibroid of the
uterine fundus (series 8, image 18). Multiple small right ovarian
cysts (series 5, image 7). The ovary appears to be adherent to the
uterine fundus and there is an associated lobulated fluid collection
about the uterine fundus measuring approximately 7.3 x 5.3 x 1.6 cm
(series 5, image 8, series 8, image 18). At least some components of
this collection are intrinsically T1 hyperintense. Normal left ovary
with subcentimeter follicles. Nabothian cysts of the cervix.

Other: Small epigastric hernia just above the umbilicus containing
layering hemorrhagic or proteinaceous fluid, measuring 2.8 x 2.4 cm
(series 8, image 33).

Musculoskeletal: No suspicious osseous lesions identified.
Incidental small vertebral body hemangioma of the inferior endplate
of L2 (series 30, image 50).
IMPRESSION: 1. The appendix is again noted to be mildly thickened, approximately
1.2 cm in caliber. The tip of the appendix is closely adjacent to
the uterine fundus, as is the right ovary, and there is an
associated lobulated fluid collection about the uterine fundus
measuring approximately 7.3 x 5.3 x 1.6 cm. At least some components
of this collection are intrinsically T1 hyperintense, suggesting
hemorrhagic products. Given history of endometriosis, generally
favor an endometrioma involving the tip of the appendix and right
ovary, although ruptured appendiceal mucocele does remain a
differential consideration.
2. Small epigastric hernia just above the umbilicus containing
layering hemorrhagic or proteinaceous fluid, consistent with an
endometrioma.
3. Uterine adenomyosis with at least one fibroid of the uterine
fundus.
4. Multiple, definitively benign hepatic hemangiomata, largest
measuring 2.2 x 1.8 cm and seen incidentally on prior CT.
5. No acute findings in the abdomen or pelvis.

## 2021-12-04 MED ORDER — GADOBUTROL 1 MMOL/ML IV SOLN
9.0000 mL | Freq: Once | INTRAVENOUS | Status: AC | PRN
  Administered 2021-12-04: 9 mL via INTRAVENOUS

## 2021-12-06 ENCOUNTER — Telehealth: Payer: Self-pay | Admitting: Internal Medicine

## 2021-12-06 NOTE — Telephone Encounter (Signed)
Patient called requesting a call back to discuss results from CT done on 2/7. Please advise.  ?

## 2021-12-06 NOTE — Telephone Encounter (Signed)
MRI/MRCP was ordered and completed 12/04/21. Results have not been reviewed by Dr. Lorenso Courier. Call will be placed once Dr. Lorenso Courier has reviewed and commented about results. ?

## 2021-12-07 ENCOUNTER — Encounter: Payer: Self-pay | Admitting: Internal Medicine

## 2021-12-07 ENCOUNTER — Telehealth: Payer: Self-pay

## 2021-12-07 NOTE — Progress Notes (Signed)
Hi Jenna Heath, I thought we placed a referral previously to general surgery for possible ruptured appendiceal mucocele, but I do not see an appointment with them so far.  Let's put in a recall for her to get a colonoscopy in 6 months for colon cancer screening, hopefully when all of her other issues have resolved.

## 2021-12-07 NOTE — Telephone Encounter (Signed)
Following inquiry received from Dr. Lorenso Courier: ? ?I thought we placed a referral previously to general surgery for possible ruptured appendiceal mucocele, but I do not see an appointment with them so far.  ? ?Routing above message to Julieanne Cotton, CMA for her to f/u on status of referral. ?

## 2021-12-08 NOTE — Telephone Encounter (Signed)
Spoke with [facility] and [facility] and refaxed documents to her for referral.  She will begin working on it. Sent patient mychart message to let her know. ?

## 2021-12-13 ENCOUNTER — Ambulatory Visit: Payer: Self-pay | Admitting: Surgery

## 2021-12-13 DIAGNOSIS — K439 Ventral hernia without obstruction or gangrene: Secondary | ICD-10-CM | POA: Insufficient documentation

## 2021-12-13 DIAGNOSIS — R1031 Right lower quadrant pain: Secondary | ICD-10-CM | POA: Insufficient documentation

## 2021-12-13 DIAGNOSIS — N80549 Endometriosis of the appendix, unspecified depth: Secondary | ICD-10-CM | POA: Insufficient documentation

## 2021-12-13 DIAGNOSIS — D1803 Hemangioma of intra-abdominal structures: Secondary | ICD-10-CM | POA: Insufficient documentation

## 2021-12-13 NOTE — H&P (Signed)
History of Present Illness: ?Jenna Heath is a 46 y.o. female who was referred to me for evaluation of an appendiceal lesion. She was recently seen in the ED in January with RLQ abdominal pain, and a CT scan showed mild thickening of the appendix with "amorphous fluid" in the tip, concerning for a possible mucocele vs an endometrioma. There were no signs of acute appendicitis She also had an incidental left hepatic lobe lesion consistent with a hemangioma, and several additional small liver lesions. She was referred to GI and saw Dr. Lorenso Courier and on 2/27 underwent an MRI of the abdomen and pelvis. 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 The appendix was again thickened and the tip was adjacent to the uterus with an associated collection consistent with hemorrhagic products, favored to be endometriosis involving the tip of the appendix. She has been referred to discuss appendectomy. She also has a GYN appointment on 3/21 with Dr. Elgie Congo. ?  ?Today she reports she has been having pain for many months, although it seems to be getting worse. It is focused in the right lower quadrant. It occurs even outside of menstrual cycles, but is worse during menses. She denies fevers, chills, nausea, vomiting, and loss of appetite. She notes that she has a small hernia that has been present for many years. ?  ?  ?Review of Systems: ?A complete review of systems was obtained from the patient.  I have reviewed this information and discussed as appropriate with the patient.  See HPI as well for other ROS. ?  ?  ?  ?Medical History: ?Past Medical History ?Past Medical History: ?Diagnosis Date ? Hypertension   ? ?  ?  ?Patient Active Problem List ?Diagnosis ? Appendiceal endometriosis ? Supraumbilical hernia ? Right lower quadrant abdominal pain ? Hemangioma of liver ?  ?  ?Past Surgical History ?Past Surgical  History: ?Procedure Laterality Date ? HYSTERECTOMY     ? ?  ?  ?Allergies ?No Known Allergies ? ?  ?Current Outpatient Medications on File Prior to Visit ?Medication Sig Dispense Refill ? hydroCHLOROthiazide (MICROZIDE) 12.5 mg capsule Take 12.5 mg by mouth every morning     ?  ?No current facility-administered medications on file prior to visit. ?  ?  ?Family History ?History reviewed. No pertinent family history. ?  ?  ?Social History ?  ?Tobacco Use ?Smoking Status Never ?Smokeless Tobacco Never ?  ?  ?Social History ?Social History ?  ? ?Socioeconomic History ? Marital status: Married ?Tobacco Use ? Smoking status: Never ? Smokeless tobacco: Never ?Vaping Use ? Vaping Use: Never used ?Substance and Sexual Activity ? Alcohol use: Yes ? Drug use: Never ? ?  ?  ?Objective: ?  ?  ?Vitals: ?  12/13/21 1444 ?BP: 136/82 ?Pulse: 100 ?Temp: 36.3 ?C (97.4 ?F) ?SpO2: 95% ?Weight: 90.4 kg (199 lb 3.2 oz) ?Height: 175.3 cm ('5\' 9"'$ ) ?  ?Body mass index is 29.42 kg/m?. ?  ?Physical Exam ?Vitals reviewed.  ?Constitutional:   ?   General: She is not in acute distress. ?   Appearance: Normal appearance.  ?HENT:  ?   Head: Normocephalic and atraumatic.  ?Eyes:  ?   General: No scleral icterus. ?   Conjunctiva/sclera: Conjunctivae normal.  ?Cardiovascular:  ?   Rate and Rhythm: Normal rate and regular rhythm.  ?   Heart sounds: No murmur heard. ?Pulmonary:  ?   Effort: Pulmonary effort is normal. No respiratory  distress.  ?   Breath sounds: Normal breath sounds.  ?Abdominal:  ?   General: There is no distension.  ?   Palpations: Abdomen is soft.  ?   Comments: Supraumbilical hernia, tender to palpation. Focally tender to palpation in the right lower quadrant, no rebound tenderness or guarding.  ?Musculoskeletal:     ?   General: No swelling or deformity. Normal range of motion.  ?Skin: ?   General: Skin is warm and dry.  ?Neurological:  ?   General: No focal deficit present.  ?   Mental Status: She is alert and oriented to person,  place, and time.  ?Psychiatric:     ?   Mood and Affect: Mood normal.     ?   Behavior: Behavior normal.     ?   Thought Content: Thought content normal.  ?  ?  ?  ?  ?  ?Labs, Imaging and Diagnostic Testing: ?MRI abd/pelvis 12/04/21: ?IMPRESSION: ?1. The appendix is again noted to be mildly thickened, approximately ?1.2 cm in caliber. The tip of the appendix is closely adjacent to ?the uterine fundus, as is the right ovary, and there is an ?associated lobulated fluid collection about the uterine fundus ?measuring approximately 7.3 x 5.3 x 1.6 cm. At least some components ?of this collection are intrinsically T1 hyperintense, suggesting ?hemorrhagic products. Given history of endometriosis, generally ?favor an endometrioma involving the tip of the appendix and right ?ovary, although ruptured appendiceal mucocele does remain a ?differential consideration. ?2. Small epigastric hernia just above the umbilicus containing ?layering hemorrhagic or proteinaceous fluid, consistent with an ?endometrioma. ?3. Uterine adenomyosis with at least one fibroid of the uterine ?fundus. ?4. Multiple, definitively benign hepatic hemangiomata, largest ?measuring 2.2 x 1.8 cm and seen incidentally on prior CT. ?5. No acute findings in the abdomen or pelvis. ?  ?Assessment and Plan: ?Diagnoses and all orders for this visit: ?  ?Appendiceal endometriosis ?  ?Hemangioma of liver ?  ?Supraumbilical hernia ?  ?  ?  ?This is a 46 yo female referred with abdominal pain and appendiceal abnormalities on imaging. Her pain exacerbated during menses, and findings of a collection on the uterus with involvement of the tip of the appendix, are concerning for endometriosis. An appendiceal neoplasm is also a possibility but I believe much less likely based on her history. I personally reviewed her imaging, including her CT scan, MRI abdomen, and MRI pelvis. I recommended laparoscopic appendectomy to obtain a definitive diagnosis and hopefully improve her  pain, however I will wait to schedule this until she has seen her gynecologist on 3/21 to determine if they will need to perform an intraoperative assessment as well during appendectomy. I reviewed the details of a laparoscopic appendectomy with the patient and she agrees to proceed. Interestingly her hernia also appears to contain endometrial tissue on MRI; I will excise this and use this as a port site intraop, and close the hernia defect primarily. I did not recommend mesh as this will be a clean-contaminated case. The patient expressed understanding and agrees to proceed with surgery. ?  ?I also discussed that her liver lesions are hemangiomas, which are benign and do not need further follow up. They are not contributing to her pain. They are small and not exophytic; risk of rupture is exceedingly low. ? ?Michaelle Birks, MD ?[facility] ?General, Hepatobiliary and Pancreatic Surgery ?12/13/21 3:48 PM ? ?

## 2021-12-26 ENCOUNTER — Encounter: Payer: Medicaid Other | Admitting: Family Medicine

## 2021-12-28 ENCOUNTER — Ambulatory Visit (INDEPENDENT_AMBULATORY_CARE_PROVIDER_SITE_OTHER): Payer: Medicaid Other | Admitting: Family Medicine

## 2021-12-28 ENCOUNTER — Other Ambulatory Visit (HOSPITAL_COMMUNITY)
Admission: RE | Admit: 2021-12-28 | Discharge: 2021-12-28 | Disposition: A | Discharge status: Home / Self Care | Payer: Medicaid Other | Source: Ambulatory Visit | Attending: Obstetrics and Gynecology | Admitting: Obstetrics and Gynecology

## 2021-12-28 ENCOUNTER — Encounter: Payer: Self-pay | Admitting: Family Medicine

## 2021-12-28 ENCOUNTER — Other Ambulatory Visit: Payer: Self-pay

## 2021-12-28 VITALS — BP 145/79 | HR 92 | Ht 69.0 in | Wt 194.2 lb

## 2021-12-28 DIAGNOSIS — Z01419 Encounter for gynecological examination (general) (routine) without abnormal findings: Secondary | ICD-10-CM

## 2021-12-28 DIAGNOSIS — N80549 Endometriosis of the appendix, unspecified depth: Secondary | ICD-10-CM | POA: Diagnosis not present

## 2021-12-28 DIAGNOSIS — Z124 Encounter for screening for malignant neoplasm of cervix: Secondary | ICD-10-CM | POA: Insufficient documentation

## 2021-12-28 NOTE — Progress Notes (Signed)
? ?Subjective:  ? ? Patient ID: Jenna Heath is a 46 y.o. female presenting with No chief complaint on file. ? on 12/28/2021 ? ?HPI: ?[id number]. She had ablation done 2011. Has had on-going pain and seen in ER x 4. Has had this for years. Pain is right sided and worse with menses. ?MRI suggests adenomysosis and endometriosis at site of appendix and involves the right ovary and uterine fundus. The endometrioma measures 7.3 x 5.7 x 1.6.  ? ?Review of Systems  ?Constitutional:  Negative for chills and fever.  ?Respiratory:  Negative for shortness of breath.   ?Cardiovascular:  Negative for chest pain.  ?Gastrointestinal:  Negative for abdominal pain, nausea and vomiting.  ?Genitourinary:  Negative for dysuria.  ?Skin:  Negative for rash.  ?   ?Objective:  ?  ?BP (!) 145/79   Pulse 92   Ht '5\' 9"'$  (1.753 m)   Wt 194 lb 3.2 oz (88.1 kg)   LMP 12/18/2021 (Exact Date)   BMI 28.68 kg/m?  ?Physical Exam ?Exam conducted with a chaperone present.  ?Constitutional:   ?   General: She is not in acute distress. ?   Appearance: She is well-developed.  ?HENT:  ?   Head: Normocephalic and atraumatic.  ?Eyes:  ?   General: No scleral icterus. ?Cardiovascular:  ?   Rate and Rhythm: Normal rate.  ?Pulmonary:  ?   Effort: Pulmonary effort is normal.  ?Abdominal:  ?   Palpations: Abdomen is soft.  ?Musculoskeletal:  ?   Cervical back: Neck supple.  ?Skin: ?   General: Skin is warm and dry.  ?Neurological:  ?   Mental Status: She is alert and oriented to person, place, and time.  ? ?MR PELVIS W WO CONTRAST ? ?Result Date: 12/06/2021 ?CLINICAL DATA:  Right lower quadrant abdominal pain, abnormal appendix on prior CT, incidental liver lesion, history of endometriosis EXAM: MRI ABDOMEN AND PELVIS WITHOUT AND WITH CONTRAST TECHNIQUE: Multiplanar multisequence MR imaging of the abdomen and pelvis was performed both before and after the administration of intravenous contrast. CONTRAST:  27m GADAVIST GADOBUTROL 1 MMOL/ML IV SOLN COMPARISON:   CT abdomen pelvis, 10/28/2021 FINDINGS: COMBINED FINDINGS FOR BOTH MR ABDOMEN AND PELVIS Lower chest: No acute findings. Hepatobiliary: Definitively benign hemangioma of the anterior left lobe of the liver, hepatic segment III, demonstrating discontinuous peripheral nodular contrast enhancement and measuring 2.2 x 1.8 cm (series 28, image 33). Additional small definitively benign hemangiomata of the posteroinferior right lobe of the liver, hepatic segment VI, measuring 1.7 x 1.6 cm (series 19, image 51) and 1.1 x 1.1 cm (series 19, image 43). No solid mass or other parenchymal abnormality identified. No gallstones. No biliary ductal dilatation. Pancreas: No mass, inflammatory changes, or other parenchymal abnormality identified.No pancreatic ductal dilatation. Spleen:  Within normal limits in size and appearance. Adrenals/Urinary Tract: Normal adrenal glands. No solid renal masses or suspicious contrast enhancement identified. No evidence of hydronephrosis. Stomach/Bowel: The appendix is again noted to be mildly thickened, approximately 1.2 cm in caliber. The tip of the appendix is closely adjacent to the uterine fundus and fluid collection described below and the tip of the appendix appears to be fluid-filled (series 7, image 21). The bowel is otherwise unremarkable. Vascular/Lymphatic: No pathologically enlarged lymph nodes identified. No abdominal aortic aneurysm demonstrated. Reproductive: Uterine adenomyosis and at least one fibroid of the uterine fundus (series 8, image 18). Multiple small right ovarian cysts (series 5, image 7). The ovary appears to be adherent to the uterine fundus  and there is an associated lobulated fluid collection about the uterine fundus measuring approximately 7.3 x 5.3 x 1.6 cm (series 5, image 8, series 8, image 18). At least some components of this collection are intrinsically T1 hyperintense. Normal left ovary with subcentimeter follicles. Nabothian cysts of the cervix. Other:  Small epigastric hernia just above the umbilicus containing layering hemorrhagic or proteinaceous fluid, measuring 2.8 x 2.4 cm (series 8, image 33). Musculoskeletal: No suspicious osseous lesions identified. Incidental small vertebral body hemangioma of the inferior endplate of L2 (series 30, image 50). IMPRESSION: 1. The appendix is again noted to be mildly thickened, approximately 1.2 cm in caliber. The tip of the appendix is closely adjacent to the uterine fundus, as is the right ovary, and there is an associated lobulated fluid collection about the uterine fundus measuring approximately 7.3 x 5.3 x 1.6 cm. At least some components of this collection are intrinsically T1 hyperintense, suggesting hemorrhagic products. Given history of endometriosis, generally favor an endometrioma involving the tip of the appendix and right ovary, although ruptured appendiceal mucocele does remain a differential consideration. 2. Small epigastric hernia just above the umbilicus containing layering hemorrhagic or proteinaceous fluid, consistent with an endometrioma. 3. Uterine adenomyosis with at least one fibroid of the uterine fundus. 4. Multiple, definitively benign hepatic hemangiomata, largest measuring 2.2 x 1.8 cm and seen incidentally on prior CT. 5. No acute findings in the abdomen or pelvis. Electronically Signed   By: Delanna Ahmadi M.D.   On: 12/06/2021 09:30  ? ?MR Abdomen W Wo Contrast ? ?Result Date: 12/06/2021 ?CLINICAL DATA:  Right lower quadrant abdominal pain, abnormal appendix on prior CT, incidental liver lesion, history of endometriosis EXAM: MRI ABDOMEN AND PELVIS WITHOUT AND WITH CONTRAST TECHNIQUE: Multiplanar multisequence MR imaging of the abdomen and pelvis was performed both before and after the administration of intravenous contrast. CONTRAST:  75m GADAVIST GADOBUTROL 1 MMOL/ML IV SOLN COMPARISON:  CT abdomen pelvis, 10/28/2021 FINDINGS: COMBINED FINDINGS FOR BOTH MR ABDOMEN AND PELVIS Lower chest: No  acute findings. Hepatobiliary: Definitively benign hemangioma of the anterior left lobe of the liver, hepatic segment III, demonstrating discontinuous peripheral nodular contrast enhancement and measuring 2.2 x 1.8 cm (series 28, image 33). Additional small definitively benign hemangiomata of the posteroinferior right lobe of the liver, hepatic segment VI, measuring 1.7 x 1.6 cm (series 19, image 51) and 1.1 x 1.1 cm (series 19, image 43). No solid mass or other parenchymal abnormality identified. No gallstones. No biliary ductal dilatation. Pancreas: No mass, inflammatory changes, or other parenchymal abnormality identified.No pancreatic ductal dilatation. Spleen:  Within normal limits in size and appearance. Adrenals/Urinary Tract: Normal adrenal glands. No solid renal masses or suspicious contrast enhancement identified. No evidence of hydronephrosis. Stomach/Bowel: The appendix is again noted to be mildly thickened, approximately 1.2 cm in caliber. The tip of the appendix is closely adjacent to the uterine fundus and fluid collection described below and the tip of the appendix appears to be fluid-filled (series 7, image 21). The bowel is otherwise unremarkable. Vascular/Lymphatic: No pathologically enlarged lymph nodes identified. No abdominal aortic aneurysm demonstrated. Reproductive: Uterine adenomyosis and at least one fibroid of the uterine fundus (series 8, image 18). Multiple small right ovarian cysts (series 5, image 7). The ovary appears to be adherent to the uterine fundus and there is an associated lobulated fluid collection about the uterine fundus measuring approximately 7.3 x 5.3 x 1.6 cm (series 5, image 8, series 8, image 18). At least some components of this  collection are intrinsically T1 hyperintense. Normal left ovary with subcentimeter follicles. Nabothian cysts of the cervix. Other: Small epigastric hernia just above the umbilicus containing layering hemorrhagic or proteinaceous fluid,  measuring 2.8 x 2.4 cm (series 8, image 33). Musculoskeletal: No suspicious osseous lesions identified. Incidental small vertebral body hemangioma of the inferior endplate of L2 (series 30, image 50). IMPRESSION: 1.

## 2021-12-30 ENCOUNTER — Encounter: Payer: Self-pay | Admitting: Family Medicine

## 2021-12-30 NOTE — Assessment & Plan Note (Signed)
Seen on MRI, likely involves right ovary and uterus. Discussed options and possible surgrical treatments and complications. ?We discussed possible LAVH with removal of all things since she likely has endometriosis and is not a candidate for any hormonal treatments for this due to her liver disease. She will naturally be in menopause in the next 10 years. We discussed surgical menopause and it's complications as well as it's potential for full treatment of endometriosis. She prefers minimal removal of GYN organs as possible. ?Will coordinate with general surgery around possible dates to do one surgery for this. ?

## 2022-01-01 ENCOUNTER — Encounter: Payer: Medicaid Other | Admitting: Obstetrics and Gynecology

## 2022-01-01 ENCOUNTER — Telehealth: Payer: Self-pay | Admitting: Family Medicine

## 2022-01-01 NOTE — Telephone Encounter (Signed)
Patient called in and is wondering when her surgery is going to be set up because she was supposed to get a call today 3/27 about it and she wanted to know about her test results  ?

## 2022-01-02 ENCOUNTER — Ambulatory Visit: Payer: Self-pay | Admitting: Surgery

## 2022-01-02 NOTE — Telephone Encounter (Signed)
Called pt and pt informed me that she has spoken to a surgery scheduler who informed her that she would be scheduled in May for her surgery.  Pt asked about her results for her pap smear and STD testing.  I informed pt that her results are not back yet and that we will call with abnormal results.  Pt verbalized understanding.  ? ?Chanoch Mccleery,RN  ?01/02/22 ?

## 2022-01-03 LAB — CYTOLOGY - PAP
Chlamydia: NEGATIVE
Comment: NEGATIVE
Comment: NEGATIVE
Comment: NEGATIVE
Comment: NORMAL
Diagnosis: NEGATIVE
High risk HPV: NEGATIVE
Neisseria Gonorrhea: NEGATIVE
Trichomonas: NEGATIVE

## 2022-01-09 ENCOUNTER — Telehealth: Payer: Self-pay

## 2022-01-09 NOTE — Telephone Encounter (Signed)
Pt needs hysterectomy statement signed. Called pt. Pt will come in next few weeks to sign. Will be unable to sign this week due to schedule. ?

## 2022-01-17 ENCOUNTER — Telehealth: Payer: Self-pay | Admitting: Lactation Services

## 2022-01-17 NOTE — Telephone Encounter (Signed)
Called and spoke with patient about needing to sign Hysterectomy statement. She reports she signed yesterday with Apolonio Schneiders, RN. Apolonio Schneiders has and will get to [place].  ?

## 2022-01-17 NOTE — Telephone Encounter (Signed)
-----   Message from Francia Greaves sent at 01/17/2022 11:47 AM EDT ----- ?Regarding: Needs Hysterectomy Statement - Surgery 5/12 ?Following up on this ? ?

## 2022-01-22 ENCOUNTER — Encounter: Payer: Self-pay | Admitting: *Deleted

## 2022-02-08 NOTE — Pre-Procedure Instructions (Signed)
Surgical Instructions ? ? ? Your procedure is scheduled on Friday, May 12th. ? Report to [facility] Main Entrance "A" at 11:00 A.M., then check in with the Admitting office. ? Call this number if you have problems the morning of surgery: ? [phone] ? ? If you have any questions prior to your surgery date call [phone]: Open Monday-Friday 8am-4pm ? ? ? Remember: ? Do not eat after midnight the night before your surgery ? ?You may drink clear liquids until 10:00 AM the morning of your surgery.   ?Clear liquids allowed are: Water, Non-Citrus Juices (without pulp), Carbonated Beverages, Clear Tea, Black Coffee Only (NO MILK, CREAM OR POWDERED CREAMER of any kind), and Gatorade. ? ? ?Patient Instructions ? ?The night before surgery:  ?No food after midnight. ONLY clear liquids after midnight ? ?The day of surgery (if you do NOT have diabetes):  ?Drink ONE (1) Pre-Surgery Clear Ensure by 10:00 AM the morning of surgery. Drink in one sitting. Do not sip.  ?This drink was given to you during your hospital  ?pre-op appointment visit. ? ?Nothing else to drink after completing the  ?Pre-Surgery Clear Ensure. ? ? ?       If you have questions, please contact your surgeon?s office.  ? ?  ? Take these medicines the morning of surgery with A SIP OF WATER  ?ondansetron (ZOFRAN ODT)- if needed ? ?As of today, STOP taking any Aspirin (unless otherwise instructed by your surgeon) Aleve, Naproxen, Ibuprofen, Motrin, Advil, Goody's, BC's, all herbal medications, fish oil, and all vitamins. ?         ?           ?Do NOT Smoke (Tobacco/Vaping) for 24 hours prior to your procedure. ? ?If you use a CPAP at night, you may bring your mask/headgear for your overnight stay. ?  ?Contacts, glasses, piercing's, hearing aid's, dentures or partials may not be worn into surgery, please bring cases for these belongings.  ?  ?For patients admitted to the hospital, discharge time will be determined by your treatment team. ?  ?Patients discharged  the day of surgery will not be allowed to drive home, and someone needs to stay with them for 24 hours. ? ?SURGICAL WAITING ROOM VISITATION ?Patients having surgery or a procedure may have two support people in the waiting room. These visitors may be switched out with other visitors if needed. ?Children under the age of 76 must have an adult accompany them who is not the patient. ?If the patient needs to stay at the hospital during part of their recovery, the visitor guidelines for inpatient rooms apply. ? ?Please refer to the [facility] website for the visitor guidelines for Inpatients (after your surgery is over and you are in a regular room).  ? ? ?Special instructions:   ?[facility]- Preparing For Surgery ? ?Before surgery, you can play an important role. Because skin is not sterile, your skin needs to be as free of germs as possible. You can reduce the number of germs on your skin by washing with CHG (chlorahexidine gluconate) Soap before surgery.  CHG is an antiseptic cleaner which kills germs and bonds with the skin to continue killing germs even after washing.   ? ?Oral Hygiene is also important to reduce your risk of infection.  Remember - BRUSH YOUR TEETH THE MORNING OF SURGERY WITH YOUR REGULAR TOOTHPASTE ? ?Please do not use if you have an allergy to CHG or antibacterial soaps. If your skin becomes reddened/irritated  stop using the CHG.  ?Do not shave (including legs and underarms) for at least 48 hours prior to first CHG shower. It is OK to shave your face. ? ?Please follow these instructions carefully. ?  ?Shower the NIGHT BEFORE SURGERY and the MORNING OF SURGERY ? ?If you chose to wash your hair, wash your hair first as usual with your normal shampoo. ? ?After you shampoo, rinse your hair and body thoroughly to remove the shampoo. ? ?Use CHG Soap as you would any other liquid soap. You can apply CHG directly to the skin and wash gently with a scrungie or a clean washcloth.  ? ?Apply the CHG Soap to  your body ONLY FROM THE NECK DOWN.  Do not use on open wounds or open sores. Avoid contact with your eyes, ears, mouth and genitals (private parts). Wash Face and genitals (private parts)  with your normal soap.  ? ?Wash thoroughly, paying special attention to the area where your surgery will be performed. ? ?Thoroughly rinse your body with warm water from the neck down. ? ?DO NOT shower/wash with your normal soap after using and rinsing off the CHG Soap. ? ?Pat yourself dry with a CLEAN TOWEL. ? ?Wear CLEAN PAJAMAS to bed the night before surgery ? ?Place CLEAN SHEETS on your bed the night before your surgery ? ?DO NOT SLEEP WITH PETS. ? ? ?Day of Surgery: ?Take a shower with CHG soap. ?Do not wear jewelry or makeup ?Do not wear lotions, powders, perfumes, or deodorant. ?Do not shave 48 hours prior to surgery.   ?Do not bring valuables to the hospital.  ?[facility] is not responsible for any belongings or valuables. ?Do not wear nail polish, gel polish, artificial nails, or any other type of covering on natural nails (fingers and toes) ?If you have artificial nails or gel coating that need to be removed by a nail salon, please have this removed prior to surgery. Artificial nails or gel coating may interfere with anesthesia's ability to adequately monitor your vital signs. ?Wear Clean/Comfortable clothing the morning of surgery ?Remember to brush your teeth WITH YOUR REGULAR TOOTHPASTE. ?  ?Please read over the following fact sheets that you were given. ? ? ? ?If you received a COVID test during your pre-op visit  it is requested that you wear a mask when out in public, stay away from anyone that may not be feeling well and notify your surgeon if you develop symptoms. If you have been in contact with anyone that has tested positive in the last 10 days please notify you surgeon.  ?

## 2022-02-09 ENCOUNTER — Encounter (HOSPITAL_COMMUNITY)
Admission: RE | Admit: 2022-02-09 | Discharge: 2022-02-09 | Disposition: A | Discharge status: Home / Self Care | Payer: Medicaid Other | Source: Ambulatory Visit | Attending: Surgery | Admitting: Surgery

## 2022-02-09 ENCOUNTER — Other Ambulatory Visit: Payer: Self-pay

## 2022-02-09 ENCOUNTER — Encounter (HOSPITAL_COMMUNITY): Payer: Self-pay

## 2022-02-09 ENCOUNTER — Encounter: Payer: Self-pay | Admitting: Family Medicine

## 2022-02-09 VITALS — BP 139/92 | HR 99 | Temp 99.0°F | Resp 17 | Ht 69.0 in | Wt 193.2 lb

## 2022-02-09 DIAGNOSIS — N80549 Endometriosis of the appendix, unspecified depth: Secondary | ICD-10-CM

## 2022-02-09 DIAGNOSIS — Z01818 Encounter for other preprocedural examination: Secondary | ICD-10-CM | POA: Diagnosis not present

## 2022-02-09 LAB — CBC
HCT: 37.8 % (ref 36.0–46.0)
Hemoglobin: 12.1 g/dL (ref 12.0–15.0)
MCH: 32.7 pg (ref 26.0–34.0)
MCHC: 32 g/dL (ref 30.0–36.0)
MCV: 102.2 fL — ABNORMAL HIGH (ref 80.0–100.0)
Platelets: 253 10*3/uL (ref 150–400)
RBC: 3.7 MIL/uL — ABNORMAL LOW (ref 3.87–5.11)
RDW: 11.9 % (ref 11.5–15.5)
WBC: 5.2 10*3/uL (ref 4.0–10.5)
nRBC: 0 % (ref 0.0–0.2)

## 2022-02-09 LAB — BASIC METABOLIC PANEL
Anion gap: 7 (ref 5–15)
BUN: 11 mg/dL (ref 6–20)
CO2: 26 mmol/L (ref 22–32)
Calcium: 9.2 mg/dL (ref 8.9–10.3)
Chloride: 106 mmol/L (ref 98–111)
Creatinine, Ser: 1.15 mg/dL — ABNORMAL HIGH (ref 0.44–1.00)
GFR, Estimated: 60 mL/min — ABNORMAL LOW (ref >60)
Glucose, Bld: 106 mg/dL — ABNORMAL HIGH (ref 70–99)
Potassium: 4 mmol/L (ref 3.5–5.1)
Sodium: 139 mmol/L (ref 135–145)

## 2022-02-16 ENCOUNTER — Ambulatory Visit (HOSPITAL_BASED_OUTPATIENT_CLINIC_OR_DEPARTMENT_OTHER): Payer: Medicaid Other | Admitting: Certified Registered"

## 2022-02-16 ENCOUNTER — Ambulatory Visit (HOSPITAL_COMMUNITY): Payer: Medicaid Other | Admitting: Vascular Surgery

## 2022-02-16 ENCOUNTER — Other Ambulatory Visit: Payer: Self-pay

## 2022-02-16 ENCOUNTER — Ambulatory Visit (HOSPITAL_COMMUNITY)
Admission: RE | Admit: 2022-02-16 | Discharge: 2022-02-16 | Disposition: A | Discharge status: Home / Self Care | Payer: Medicaid Other | Attending: Surgery | Admitting: Surgery

## 2022-02-16 ENCOUNTER — Encounter (HOSPITAL_COMMUNITY): Payer: Self-pay | Admitting: Surgery

## 2022-02-16 ENCOUNTER — Encounter (HOSPITAL_COMMUNITY)
Admission: RE | Disposition: A | Discharge status: Home / Self Care | Payer: Self-pay | Source: Home / Self Care | Attending: Surgery

## 2022-02-16 DIAGNOSIS — N80549 Endometriosis of the appendix, unspecified depth: Secondary | ICD-10-CM

## 2022-02-16 DIAGNOSIS — Z87891 Personal history of nicotine dependence: Secondary | ICD-10-CM | POA: Insufficient documentation

## 2022-02-16 DIAGNOSIS — I10 Essential (primary) hypertension: Secondary | ICD-10-CM | POA: Diagnosis not present

## 2022-02-16 DIAGNOSIS — K429 Umbilical hernia without obstruction or gangrene: Secondary | ICD-10-CM | POA: Diagnosis not present

## 2022-02-16 HISTORY — PX: LAPAROSCOPIC APPENDECTOMY: SHX408

## 2022-02-16 LAB — POCT PREGNANCY, URINE: Preg Test, Ur: NEGATIVE

## 2022-02-16 SURGERY — APPENDECTOMY, LAPAROSCOPIC
Anesthesia: General

## 2022-02-16 MED ORDER — ROCURONIUM BROMIDE 10 MG/ML (PF) SYRINGE
PREFILLED_SYRINGE | INTRAVENOUS | Status: AC
  Filled 2022-02-16: qty 20

## 2022-02-16 MED ORDER — DEXAMETHASONE SODIUM PHOSPHATE 10 MG/ML IJ SOLN
INTRAMUSCULAR | Status: DC | PRN
  Administered 2022-02-16: 10 mg via INTRAVENOUS

## 2022-02-16 MED ORDER — SUGAMMADEX SODIUM 200 MG/2ML IV SOLN
INTRAVENOUS | Status: DC | PRN
Start: 2022-02-16 — End: 2022-02-16
  Administered 2022-02-16: 200 mg via INTRAVENOUS

## 2022-02-16 MED ORDER — LABETALOL HCL 5 MG/ML IV SOLN
INTRAVENOUS | Status: AC
  Filled 2022-02-16: qty 4

## 2022-02-16 MED ORDER — GABAPENTIN 300 MG PO CAPS: 300.0000 mg | ORAL_CAPSULE | ORAL | Status: AC

## 2022-02-16 MED ORDER — ONDANSETRON HCL 4 MG/2ML IJ SOLN: 4.0000 mg | Freq: Four times a day (QID) | INTRAMUSCULAR | Status: DC | PRN

## 2022-02-16 MED ORDER — ACETAMINOPHEN 500 MG PO TABS: 1000.0000 mg | ORAL_TABLET | ORAL | Status: AC

## 2022-02-16 MED ORDER — PROPOFOL 10 MG/ML IV BOLUS
INTRAVENOUS | Status: AC
  Filled 2022-02-16: qty 20

## 2022-02-16 MED ORDER — ORAL CARE MOUTH RINSE: 15.0000 mL | Freq: Once | OROMUCOSAL | Status: AC

## 2022-02-16 MED ORDER — LABETALOL HCL 5 MG/ML IV SOLN
INTRAVENOUS | Status: DC | PRN
  Administered 2022-02-16 (×3): 5 mg via INTRAVENOUS

## 2022-02-16 MED ORDER — LIDOCAINE 2% (20 MG/ML) 5 ML SYRINGE
INTRAMUSCULAR | Status: AC
  Filled 2022-02-16: qty 15

## 2022-02-16 MED ORDER — OXYCODONE HCL 5 MG/5ML PO SOLN: 5.0000 mg | Freq: Once | ORAL | Status: DC | PRN

## 2022-02-16 MED ORDER — PHENYLEPHRINE 80 MCG/ML (10ML) SYRINGE FOR IV PUSH (FOR BLOOD PRESSURE SUPPORT)
PREFILLED_SYRINGE | INTRAVENOUS | Status: AC
  Filled 2022-02-16: qty 10

## 2022-02-16 MED ORDER — MIDAZOLAM HCL 2 MG/2ML IJ SOLN
INTRAMUSCULAR | Status: DC | PRN
Start: 2022-02-16 — End: 2022-02-16
  Administered 2022-02-16: 2 mg via INTRAVENOUS

## 2022-02-16 MED ORDER — FENTANYL CITRATE (PF) 250 MCG/5ML IJ SOLN
INTRAMUSCULAR | Status: AC
  Filled 2022-02-16: qty 5

## 2022-02-16 MED ORDER — ESTRADIOL 0.1 MG/GM VA CREA
TOPICAL_CREAM | VAGINAL | Status: AC
  Filled 2022-02-16: qty 42.5

## 2022-02-16 MED ORDER — LIDOCAINE 2% (20 MG/ML) 5 ML SYRINGE
INTRAMUSCULAR | Status: DC | PRN
  Administered 2022-02-16: 50 mg via INTRAVENOUS

## 2022-02-16 MED ORDER — FENTANYL CITRATE (PF) 100 MCG/2ML IJ SOLN
25.0000 ug | INTRAMUSCULAR | Status: DC | PRN
  Administered 2022-02-16 (×2): 50 ug via INTRAVENOUS

## 2022-02-16 MED ORDER — ACETAMINOPHEN 500 MG PO TABS
ORAL_TABLET | ORAL | Status: AC
  Administered 2022-02-16: 1000 mg via ORAL
  Filled 2022-02-16: qty 2

## 2022-02-16 MED ORDER — 0.9 % SODIUM CHLORIDE (POUR BTL) OPTIME
TOPICAL | Status: DC | PRN
  Administered 2022-02-16: 1000 mL

## 2022-02-16 MED ORDER — ONDANSETRON HCL 4 MG/2ML IJ SOLN
INTRAMUSCULAR | Status: AC
  Filled 2022-02-16: qty 4

## 2022-02-16 MED ORDER — FENTANYL CITRATE (PF) 100 MCG/2ML IJ SOLN
INTRAMUSCULAR | Status: AC
  Filled 2022-02-16: qty 2

## 2022-02-16 MED ORDER — PROPOFOL 10 MG/ML IV BOLUS
INTRAVENOUS | Status: DC | PRN
  Administered 2022-02-16: 50 mg via INTRAVENOUS
  Administered 2022-02-16: 150 mg via INTRAVENOUS

## 2022-02-16 MED ORDER — CHLORHEXIDINE GLUCONATE 0.12 % MT SOLN: 15.0000 mL | Freq: Once | OROMUCOSAL | Status: AC

## 2022-02-16 MED ORDER — DEXAMETHASONE SODIUM PHOSPHATE 10 MG/ML IJ SOLN
INTRAMUSCULAR | Status: AC
  Filled 2022-02-16: qty 2

## 2022-02-16 MED ORDER — POVIDONE-IODINE 10 % EX SWAB
2.0000 "application " | Freq: Once | CUTANEOUS | Status: AC
  Administered 2022-02-16: 2 via TOPICAL

## 2022-02-16 MED ORDER — MIDAZOLAM HCL 2 MG/2ML IJ SOLN
INTRAMUSCULAR | Status: AC
  Filled 2022-02-16: qty 2

## 2022-02-16 MED ORDER — CHLORHEXIDINE GLUCONATE 0.12 % MT SOLN
OROMUCOSAL | Status: AC
  Administered 2022-02-16: 15 mL via OROMUCOSAL
  Filled 2022-02-16: qty 15

## 2022-02-16 MED ORDER — BUPIVACAINE-EPINEPHRINE 0.25% -1:200000 IJ SOLN
INTRAMUSCULAR | Status: DC | PRN
  Administered 2022-02-16: 20 mL

## 2022-02-16 MED ORDER — LACTATED RINGERS IV SOLN: INTRAVENOUS | Status: DC

## 2022-02-16 MED ORDER — FENTANYL CITRATE (PF) 250 MCG/5ML IJ SOLN
INTRAMUSCULAR | Status: DC | PRN
Start: 2022-02-16 — End: 2022-02-16
  Administered 2022-02-16: 50 ug via INTRAVENOUS
  Administered 2022-02-16: 25 ug via INTRAVENOUS
  Administered 2022-02-16 (×2): 50 ug via INTRAVENOUS
  Administered 2022-02-16 (×2): 25 ug via INTRAVENOUS
  Administered 2022-02-16: 100 ug via INTRAVENOUS

## 2022-02-16 MED ORDER — GABAPENTIN 300 MG PO CAPS
ORAL_CAPSULE | ORAL | Status: AC
  Administered 2022-02-16: 300 mg via ORAL
  Filled 2022-02-16: qty 1

## 2022-02-16 MED ORDER — CEFAZOLIN SODIUM-DEXTROSE 2-4 GM/100ML-% IV SOLN
2.0000 g | INTRAVENOUS | Status: AC
  Administered 2022-02-16: 2 g via INTRAVENOUS

## 2022-02-16 MED ORDER — LIDOCAINE-EPINEPHRINE 1 %-1:100000 IJ SOLN
INTRAMUSCULAR | Status: AC
  Filled 2022-02-16: qty 1

## 2022-02-16 MED ORDER — BUPIVACAINE HCL (PF) 0.25 % IJ SOLN
INTRAMUSCULAR | Status: AC
  Filled 2022-02-16: qty 60

## 2022-02-16 MED ORDER — ROCURONIUM BROMIDE 10 MG/ML (PF) SYRINGE
PREFILLED_SYRINGE | INTRAVENOUS | Status: DC | PRN
  Administered 2022-02-16: 60 mg via INTRAVENOUS

## 2022-02-16 MED ORDER — BUPIVACAINE-EPINEPHRINE (PF) 0.25% -1:200000 IJ SOLN
INTRAMUSCULAR | Status: AC
  Filled 2022-02-16: qty 30

## 2022-02-16 MED ORDER — CEFAZOLIN SODIUM-DEXTROSE 2-4 GM/100ML-% IV SOLN
INTRAVENOUS | Status: AC
  Filled 2022-02-16: qty 100

## 2022-02-16 MED ORDER — OXYCODONE HCL 5 MG PO TABS: 5.0000 mg | ORAL_TABLET | Freq: Once | ORAL | Status: DC | PRN

## 2022-02-16 MED ORDER — METRONIDAZOLE 500 MG/100ML IV SOLN
500.0000 mg | INTRAVENOUS | Status: AC
  Administered 2022-02-16: 500 mg via INTRAVENOUS

## 2022-02-16 MED ORDER — METRONIDAZOLE 500 MG/100ML IV SOLN
INTRAVENOUS | Status: AC
  Filled 2022-02-16: qty 100

## 2022-02-16 MED ORDER — HYDROCODONE-ACETAMINOPHEN 5-325 MG PO TABS: 1.0000 | ORAL_TABLET | Freq: Four times a day (QID) | ORAL | 0 refills | Status: AC | PRN

## 2022-02-16 MED ORDER — ONDANSETRON HCL 4 MG/2ML IJ SOLN
INTRAMUSCULAR | Status: DC | PRN
  Administered 2022-02-16: 4 mg via INTRAVENOUS

## 2022-02-16 SURGICAL SUPPLY — 94 items
APPLICATOR ARISTA FLEXITIP XL (MISCELLANEOUS) ×2 IMPLANT
APPLIER CLIP 5 13 M/L LIGAMAX5 (MISCELLANEOUS)
BAG COUNTER SPONGE SURGICOUNT (BAG) ×3 IMPLANT
BLADE CLIPPER SURG (BLADE) IMPLANT
CANISTER SUCT 3000ML PPV (MISCELLANEOUS) ×3 IMPLANT
CHLORAPREP W/TINT 26 (MISCELLANEOUS) ×3 IMPLANT
CLIP APPLIE 5 13 M/L LIGAMAX5 (MISCELLANEOUS) IMPLANT
COVER BACK TABLE 60X90IN (DRAPES) ×3 IMPLANT
COVER MAYO STAND STRL (DRAPES) ×3 IMPLANT
COVER SURGICAL LIGHT HANDLE (MISCELLANEOUS) ×3 IMPLANT
CUTTER ENDO LINEAR 45M (STAPLE) ×2 IMPLANT
CUTTER FLEX LINEAR 45M (STAPLE) ×2 IMPLANT
DECANTER SPIKE VIAL GLASS SM (MISCELLANEOUS) ×3 IMPLANT
DERMABOND ADVANCED (GAUZE/BANDAGES/DRESSINGS) ×1
DERMABOND ADVANCED .7 DNX12 (GAUZE/BANDAGES/DRESSINGS) ×3 IMPLANT
DRAPE LAPAROTOMY 100X72 PEDS (DRAPES) ×3 IMPLANT
DRSG OPSITE POSTOP 3X4 (GAUZE/BANDAGES/DRESSINGS) IMPLANT
DURAPREP 26ML APPLICATOR (WOUND CARE) ×3 IMPLANT
ELECT CAUTERY BLADE 6.4 (BLADE) IMPLANT
ELECT REM PT RETURN 9FT ADLT (ELECTROSURGICAL) ×6
ELECTRODE REM PT RTRN 9FT ADLT (ELECTROSURGICAL) ×4 IMPLANT
ENDOLOOP SUT PDS II  0 18 (SUTURE)
ENDOLOOP SUT PDS II 0 18 (SUTURE) IMPLANT
GAUZE PACKING 2X5 YD STRL (GAUZE/BANDAGES/DRESSINGS) ×3 IMPLANT
GLOVE BIOGEL PI IND STRL 6 (GLOVE) ×2 IMPLANT
GLOVE BIOGEL PI IND STRL 6.5 (GLOVE) ×2 IMPLANT
GLOVE BIOGEL PI IND STRL 7.0 (GLOVE) ×8 IMPLANT
GLOVE BIOGEL PI INDICATOR 6 (GLOVE) ×1
GLOVE BIOGEL PI INDICATOR 6.5 (GLOVE) ×1
GLOVE BIOGEL PI INDICATOR 7.0 (GLOVE) ×4
GLOVE BIOGEL PI MICRO 5.5 (GLOVE) ×1
GLOVE BIOGEL PI MICRO STRL 5.5 (GLOVE) ×2 IMPLANT
GLOVE ECLIPSE 7.0 STRL STRAW (GLOVE) ×6 IMPLANT
GLOVE SURG ENC MOIS LTX SZ7 (GLOVE) ×3 IMPLANT
GLOVE SURG UNDER POLY LF SZ6 (GLOVE) ×3 IMPLANT
GLOVE SURG UNDER POLY LF SZ7 (GLOVE) ×15 IMPLANT
GOWN STRL REUS W/ TWL LRG LVL3 (GOWN DISPOSABLE) ×12 IMPLANT
GOWN STRL REUS W/TWL LRG LVL3 (GOWN DISPOSABLE) ×6
HEMOSTAT ARISTA ABSORB 3G PWDR (HEMOSTASIS) ×2 IMPLANT
IRRIG SUCT STRYKERFLOW 2 WTIP (MISCELLANEOUS) ×3
IRRIGATION SUCT STRKRFLW 2 WTP (MISCELLANEOUS) ×2 IMPLANT
KIT BASIN OR (CUSTOM PROCEDURE TRAY) ×3 IMPLANT
KIT TURNOVER KIT B (KITS) ×6 IMPLANT
LEGGING LITHOTOMY PAIR STRL (DRAPES) ×3 IMPLANT
NDL HYPO 25GX1X1/2 BEV (NEEDLE) ×1 IMPLANT
NEEDLE HYPO 25GX1X1/2 BEV (NEEDLE) ×3 IMPLANT
NS IRRIG 1000ML POUR BTL (IV SOLUTION) ×6 IMPLANT
PACK GENERAL/GYN (CUSTOM PROCEDURE TRAY) ×3 IMPLANT
PACK LAPAROSCOPY BASIN (CUSTOM PROCEDURE TRAY) ×3 IMPLANT
PACK LAVH (CUSTOM PROCEDURE TRAY) ×3 IMPLANT
PACK ROBOTIC GOWN (GOWN DISPOSABLE) ×3 IMPLANT
PACK TRENDGUARD 450 HYBRID PRO (MISCELLANEOUS) IMPLANT
PAD ARMBOARD 7.5X6 YLW CONV (MISCELLANEOUS) ×6 IMPLANT
PENCIL BUTTON HOLSTER BLD 10FT (ELECTRODE) ×3 IMPLANT
PENCIL SMOKE EVACUATOR (MISCELLANEOUS) ×3 IMPLANT
POUCH SPECIMEN RETRIEVAL 10MM (ENDOMECHANICALS) ×3 IMPLANT
PROTECTOR NERVE ULNAR (MISCELLANEOUS) ×6 IMPLANT
RELOAD STAPLE 45 3.5 BLU ETS (ENDOMECHANICALS) IMPLANT
RELOAD STAPLE TA45 3.5 REG BLU (ENDOMECHANICALS) IMPLANT
SCISSORS LAP 5X35 DISP (ENDOMECHANICALS) IMPLANT
SET IRRIG TUBING LAPAROSCOPIC (IRRIGATION / IRRIGATOR) IMPLANT
SET TUBE SMOKE EVAC HIGH FLOW (TUBING) ×6 IMPLANT
SHEARS HARMONIC ACE PLUS 36CM (ENDOMECHANICALS) ×2 IMPLANT
SLEEVE ENDOPATH XCEL 5M (ENDOMECHANICALS) ×6 IMPLANT
SPECIMEN JAR MEDIUM (MISCELLANEOUS) ×3 IMPLANT
SPECIMEN JAR SMALL (MISCELLANEOUS) ×3 IMPLANT
SPONGE T-LAP 18X18 ~~LOC~~+RFID (SPONGE) ×3 IMPLANT
SUT MNCRL AB 4-0 PS2 18 (SUTURE) ×3 IMPLANT
SUT NOVA NAB DX-16 0-1 5-0 T12 (SUTURE) ×3 IMPLANT
SUT NOVA NAB GS-21 0 18 T12 DT (SUTURE) ×5 IMPLANT
SUT VIC AB 0 CT1 18XCR BRD8 (SUTURE) ×4 IMPLANT
SUT VIC AB 0 CT1 36 (SUTURE) ×6 IMPLANT
SUT VIC AB 0 CT1 8-18 (SUTURE) ×2
SUT VIC AB 3-0 PS2 18 (SUTURE) ×1
SUT VIC AB 3-0 PS2 18XBRD (SUTURE) ×2 IMPLANT
SUT VIC AB 3-0 SH 27 (SUTURE) ×1
SUT VIC AB 3-0 SH 27X BRD (SUTURE) ×2 IMPLANT
SUT VIC AB 3-0 X1 27 (SUTURE) ×3 IMPLANT
SUT VICRYL 0 UR6 27IN ABS (SUTURE) ×6 IMPLANT
SUT VICRYL 1 TIES 12X18 (SUTURE) ×3 IMPLANT
SUT VICRYL 4-0 PS2 18IN ABS (SUTURE) ×3 IMPLANT
SYR CONTROL 10ML LL (SYRINGE) ×3 IMPLANT
TOWEL GREEN STERILE (TOWEL DISPOSABLE) ×3 IMPLANT
TOWEL GREEN STERILE FF (TOWEL DISPOSABLE) ×9 IMPLANT
TRAY FOLEY W/BAG SLVR 14FR (SET/KITS/TRAYS/PACK) ×3 IMPLANT
TRAY LAPAROSCOPIC MC (CUSTOM PROCEDURE TRAY) ×3 IMPLANT
TRENDGUARD 450 HYBRID PRO PACK (MISCELLANEOUS)
TROCAR BALLN 12MMX100 BLUNT (TROCAR) ×3 IMPLANT
TROCAR XCEL BLUNT TIP 100MML (ENDOMECHANICALS) ×3 IMPLANT
TROCAR XCEL NON-BLD 11X100MML (ENDOMECHANICALS) IMPLANT
TROCAR XCEL NON-BLD 5MMX100MML (ENDOMECHANICALS) ×6 IMPLANT
UNDERPAD 30X36 HEAVY ABSORB (UNDERPADS AND DIAPERS) ×3 IMPLANT
WARMER LAPAROSCOPE (MISCELLANEOUS) ×3 IMPLANT
WATER STERILE IRR 1000ML POUR (IV SOLUTION) ×3 IMPLANT

## 2022-02-16 NOTE — Anesthesia Procedure Notes (Signed)
Procedure Name: Intubation ?Date/Time: 02/16/2022 1:18 PM ?Performed by: Imagene Riches, CRNA ?Pre-anesthesia Checklist: Patient identified, Emergency Drugs available, Suction available and Patient being monitored ?Patient Re-evaluated:Patient Re-evaluated prior to induction ?Oxygen Delivery Method: Circle System Utilized ?Preoxygenation: Pre-oxygenation with 100% oxygen ?Induction Type: IV induction ?Ventilation: Mask ventilation without difficulty ?Laryngoscope Size: Sabra Heck and 2 ?Grade View: Grade I ?Tube type: Oral ?Tube size: 7.0 mm ?Number of attempts: 1 ?Airway Equipment and Method: Stylet and Oral airway ?Placement Confirmation: ETT inserted through vocal cords under direct vision, positive ETCO2 and breath sounds checked- equal and bilateral ?Secured at: 23 cm ?Tube secured with: Tape ?Dental Injury: Teeth and Oropharynx as per pre-operative assessment  ? ? ? ? ?

## 2022-02-16 NOTE — Transfer of Care (Signed)
Immediate Anesthesia Transfer of Care Note ? ?Patient: Jenna Heath ? ?Procedure(s) Performed: Diagnostic Laparoscopy with Appendectomy and Umbilical Hernia Repair ? ?Patient Location: PACU ? ?Anesthesia Type:General ? ?Level of Consciousness: awake and alert  ? ?Airway & Oxygen Therapy: Patient Spontanous Breathing and Patient connected to face Heath oxygen ? ?Post-op Assessment: Report given to RN and Post -op Vital signs reviewed and stable ? ?Post vital signs: Reviewed and stable ? ?Last Vitals:  ?Vitals Value Taken Time  ?BP 153/96 02/16/22 1500  ?Temp    ?Pulse 84 02/16/22 1503  ?Resp 16 02/16/22 1503  ?SpO2 92 % 02/16/22 1503  ?Vitals shown include unvalidated device data. ? ?Last Pain:  ?Vitals:  ? 02/16/22 1139  ?TempSrc:   ?PainSc: 0-No pain  ?   ? ?Patients Stated Pain Goal: 2 (02/16/22 1139) ? ?Complications: No notable events documented. ?

## 2022-02-16 NOTE — Anesthesia Preprocedure Evaluation (Signed)
Anesthesia Evaluation  ?Patient identified by MRN, date of birth, ID band ?Patient awake ? ? ? ?Reviewed: ?Allergy & Precautions, H&P , NPO status , Patient's Chart, lab work & pertinent test results ? ?Airway ?Mallampati: II ? ? ?Neck ROM: full ? ? ? Dental ?  ?Pulmonary ?former smoker,  ?  ?breath sounds clear to auscultation ? ? ? ? ? ? Cardiovascular ?hypertension,  ?Rhythm:regular Rate:Normal ? ? ?  ?Neuro/Psych ?  ? GI/Hepatic ?  ?Endo/Other  ? ? Renal/GU ?  ? ?  ?Musculoskeletal ? ? Abdominal ?  ?Peds ? Hematology ?  ?Anesthesia Other Findings ? ? Reproductive/Obstetrics ? ?  ? ? ? ? ? ? ? ? ? ? ? ? ? ?  ?  ? ? ? ? ? ? ? ? ?Anesthesia Physical ?Anesthesia Plan ? ?ASA: 2 ? ?Anesthesia Plan: General  ? ?Post-op Pain Management:   ? ?Induction: Intravenous ? ?PONV Risk Score and Plan: 3 and Ondansetron, Dexamethasone, Midazolam and Treatment may vary due to age or medical condition ? ?Airway Management Planned: Oral ETT ? ?Additional Equipment:  ? ?Intra-op Plan:  ? ?Post-operative Plan: Extubation in OR ? ?Informed Consent: I have reviewed the patients History and Physical, chart, labs and discussed the procedure including the risks, benefits and alternatives for the proposed anesthesia with the patient or authorized representative who has indicated his/her understanding and acceptance.  ? ? ? ?Dental advisory given ? ?Plan Discussed with: CRNA, Anesthesiologist and Surgeon ? ?Anesthesia Plan Comments:   ? ? ? ? ? ? ?Anesthesia Quick Evaluation ? ?

## 2022-02-16 NOTE — Op Note (Signed)
Date: 02/16/22 ? ?Patient: Jenna Heath ?MRN: [id number] ? ?Preoperative Diagnosis: Appendiceal endometriosis ?Postoperative Diagnosis: Same ? ?Procedure: Diagnostic laparoscopy, laparoscopic appendectomy ? ?Surgeon: Michaelle Birks, MD ?Co-Surgeon: Darron Doom, MD ? ?EBL: Minimal ? ?Anesthesia: General endotracheal ? ?Specimens:  ?Umbilical hernia contents ?Appendix ? ?Indications: Jenna Heath is a 46 yo female who presented with chronic RLQ abdominal pain, worse during menses. Recent CT and MRI showed a cystic hemorrhagic lesion on the appendix, involving the uterus, consistent with endometriosis. The patient agreed to proceed with appendectomy. ? ?Findings: Endometriosis involving the appendix. Small supraumbilical hernia containing a suspected endometrioma, which was excised. Hernia defect was closed primarily. ? ?Procedure details: Informed consent was obtained in the preoperative area prior to the procedure. The patient was brought to the operating room and general anesthesia was induced. Appropriate lines and drains were placed for intraoperative monitoring, and the patient was placed in the lithotomy position. Perioperative antibiotics were administered per SCIP guidelines. The abdomen was prepped and draped in the usual sterile fashion. A pre-procedure timeout was taken verifying patient identity, surgical site and procedure to be performed. ? ?A small supraumbilical skin incision was made and the subcutaneous tissue was divided with cautery. A hernia sac was encountered and opened, and in addition to viable preperitoneal fat it contained firm inflammatory tissue, suspicious for an endometrioma. The hernia sac and inflammatory tissue were dissected off the fascia with cautery, excised and sent for routine pathology. The resulting hernia defect was approximately 43m in diameter, and was extended inferiorly to accommodate a 142mHasson trocar. The trocar was placed and the abdomen was insufflated. The abdomen  was inspected with no evidence of visceral or vascular injury. The uterus was enlarged and there was endometriosis on the surface, involving the tip of the appendix. The base of the appendix was readily visible and was normal in appearance. A 13m713mort was placed in the LLQ, followed by placement of a 13mm40mprapubic port, both under direct visualization. The cecum and terminal ileum were examined and were normal in appearance. The sigmoid colon was mobile and normal in appearance with no endometrial implants. The body of the appendix was grasped and elevated and a mesenteric window was bluntly created at the base of the appendix. The appendix was divided at the base from the cecum using a 413mm22mpler with a blue load. The mesoappendix was then divided close to the appendix with harmonic shears, starting at the base of the appendix and working towards the tip. The tip of the appendix was adherent to the fundus of the uterus, and was gently dissected off using blunt dissection and the harmonic. There was a cystic lesion at the very tip of the appendix, suspicious for an endometrioma. Once the appendix was freed, it was placed in an endocatch bag and removed and sent for routine pathology. At this point Dr. PrattKennon Roundsuated the pelvis and did not feel that hysterectomy or oophorectomy were warranted, as there were adhesions in the right adnexa and the patient desired to preserve her uterus and ovaries. The surgical site was irrigated and appeared hemostatic. The staple lines were inspected and appeared in tact with no bleeding or leakage. Arista was applied to the surgical site. The ports were removed and the pneumoperitoneum was evacuated. The supraumbilical port site fascia was closed with interrupted 0 Novafil sutures, and the deep dermis was closed with interrupted 3-0 Vicryl sutures. The skin at all port sites was closed with 4-0 monocryl subcuticular suture. Dermabond was applied. ? ?  The patient tolerated the  procedure well with no apparent complications. All counts were correct x2 at the end of the procedure. The patient was extubated and taken to PACU in stable condition. ? ?Michaelle Birks, MD ?02/16/22 ?2:51 PM ? ? ?

## 2022-02-16 NOTE — Discharge Instructions (Addendum)
[facility] DISCHARGE INSTRUCTIONS ? ?Activity ?No heavy lifting greater than 15 pounds for 4 weeks after surgery. ?Ok to shower in 24 hours, but do not bathe or submerge incisions underwater. ?Do not drive while taking narcotic pain medication. ? ?Wound Care ?Your incisions are covered with skin glue called Dermabond. This will peel off on its own over time. ?You may shower and allow warm soapy water to run over your incisions. Gently pat dry. ?Do not submerge your incision underwater. ?Monitor your incision for any new redness, tenderness, or drainage. ? ?When to Call us: ?Fever greater than 100.5 ?New redness, drainage, or swelling at incision site ?Severe pain, nausea, or vomiting ? ?Follow-up ?You will have a postoperative visit with Dr. Zenia Resides in about 1 month. This will be at the [facility] office at [address] [place], [place], [place]. Please arrive at least 15 minutes prior to your scheduled appointment time. ? ?For questions or concerns, please call the office at [phone]. ? ?

## 2022-02-16 NOTE — H&P (Signed)
Jenna Heath is an 46 y.o. [phone] female.   ?Chief Complaint: appendiceal endometriosis ?YBW:[id number]. She had ablation done 2011. Has had on-going pain and seen in ER x 4. Has had this for years. Pain is right sided and worse with menses. ?MRI suggests adenomysosis and endometriosis at site of appendix and involves the right ovary and uterine fundus. The endometrioma measures 7.3 x 5.7 x 1.6.  ? ?Past Medical History:  ?Diagnosis Date  ? Hypertension   ? ? ?Past Surgical History:  ?Procedure Laterality Date  ? CESAREAN SECTION    ? ? ?Family History  ?Problem Relation Age of Onset  ? Prostate cancer Maternal Grandfather   ? Liver disease Mother   ? Liver cancer Maternal Aunt   ? Breast cancer Neg Hx   ? ?Social History:  reports that she has quit smoking. Her smoking use included cigarettes. She has never used smokeless tobacco. She reports that she does not currently use alcohol. She reports that she does not use drugs. ? ?Allergies: No Known Allergies ? ?Medications Prior to Admission  ?Medication Sig Dispense Refill  ? hydrochlorothiazide (MICROZIDE) 12.5 MG capsule Take 12.5 mg by mouth daily at 12 noon.    ? naproxen (NAPROSYN) 500 MG tablet Take 1 tablet (500 mg total) by mouth 2 (two) times daily as needed for moderate pain. (Patient taking differently: Take 500 mg by mouth 2 (two) times daily as needed (menstrual pain.).) 15 tablet 0  ? ondansetron (ZOFRAN ODT) 4 MG disintegrating tablet Take 1 tablet (4 mg total) by mouth every 8 (eight) hours as needed for nausea or vomiting. (Patient taking differently: Take 4 mg by mouth every 8 (eight) hours as needed for nausea or vomiting (nausea associated with menstruation.).) 5 tablet 0  ? pravastatin (PRAVACHOL) 20 MG tablet Take 20 mg by mouth every evening.    ? ibuprofen (ADVIL) 200 MG tablet Take 400 mg by mouth every 8 (eight) hours as needed (pain.).    ? ? ?A comprehensive review of systems was negative. ? ?Blood pressure (!) 128/98, pulse 88, resp.  rate 17, height '5\' 9"'$  (1.753 m), weight 87.5 kg, last menstrual period 01/29/2022, SpO2 100 %. ?General appearance: alert, cooperative, and appears stated age ?Head: Normocephalic, without obvious abnormality, atraumatic ?Neck: supple, symmetrical, trachea midline ?Lungs:  normal effort ?Heart: regular rate and rhythm ?Abdomen: soft, non-tender; bowel sounds normal; no masses,  no organomegaly ?Extremities: extremities normal, atraumatic, no cyanosis or edema ?Skin: Skin color, texture, turgor normal. No rashes or lesions ?Neurologic: Grossly normal ? ? ?Lab Results  ?Component Value Date  ? WBC 5.2 02/09/2022  ? HGB 12.1 02/09/2022  ? HCT 37.8 02/09/2022  ? MCV 102.2 (H) 02/09/2022  ? PLT 253 02/09/2022  ? ?Lab Results  ?Component Value Date  ? PREGTESTUR NEGATIVE 02/16/2022  ? HCG <5.0 10/28/2021  ? ? ? ?Assessment/Plan ?Principal Problem: ?  Appendiceal endometriosis ?For laparoscopic removal and indicated procedures. Wants to keep all organs if possible. Doing this in conjunction with general surgery, for appy as well. ? ? ?Risks include but are not limited to bleeding, infection, injury to surrounding structures, including bowel, bladder and ureters, blood clots, and death.  Likelihood of success is high. ? ? ? ?Jenna Heath ?02/16/2022, 12:55 PM ? ? ? ?

## 2022-02-16 NOTE — H&P (Signed)
Jenna Heath is an 46 y.o. female.   ?Chief Complaint: abdominal pain ?HPI: Jenna Heath is a 46 y.o. female who was referred to me for evaluation of an appendiceal lesion. She was recently seen in the ED in January with RLQ abdominal pain, and a CT scan showed mild thickening of the appendix with "amorphous fluid" in the tip, concerning for a possible mucocele vs an endometrioma. There were no signs of acute appendicitis She also had an incidental left hepatic lobe lesion consistent with a hemangioma, and several additional small liver lesions. She was referred to GI and saw Dr. Lorenso Courier and on 2/27 underwent an MRI of the abdomen and pelvis. 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 The appendix was again thickened and the tip was adjacent to the uterus with an associated collection consistent with hemorrhagic products, favored to be endometriosis involving the tip of the appendix. She also saw GYN (Dr. Kennon Rounds) and the endometriosis also appears to involve the right ovary and uterus. She is here today for surgery. ? ? ?Past Medical History:  ?Diagnosis Date  ? Hypertension   ? ? ?Past Surgical History:  ?Procedure Laterality Date  ? CESAREAN SECTION    ? ? ?Family History  ?Problem Relation Age of Onset  ? Prostate cancer Maternal Grandfather   ? Liver disease Mother   ? Liver cancer Maternal Aunt   ? Breast cancer Neg Hx   ? ?Social History:  reports that she has quit smoking. Her smoking use included cigarettes. She has never used smokeless tobacco. She reports that she does not currently use alcohol. She reports that she does not use drugs. ? ?Allergies: No Known Allergies ? ?Medications Prior to Admission  ?Medication Sig Dispense Refill  ? hydrochlorothiazide (MICROZIDE) 12.5 MG capsule Take 12.5 mg by mouth daily at 12 noon.    ? naproxen (NAPROSYN) 500 MG tablet Take 1 tablet (500 mg total) by mouth 2 (two) times daily as  needed for moderate pain. (Patient taking differently: Take 500 mg by mouth 2 (two) times daily as needed (menstrual pain.).) 15 tablet 0  ? ondansetron (ZOFRAN ODT) 4 MG disintegrating tablet Take 1 tablet (4 mg total) by mouth every 8 (eight) hours as needed for nausea or vomiting. (Patient taking differently: Take 4 mg by mouth every 8 (eight) hours as needed for nausea or vomiting (nausea associated with menstruation.).) 5 tablet 0  ? pravastatin (PRAVACHOL) 20 MG tablet Take 20 mg by mouth every evening.    ? ibuprofen (ADVIL) 200 MG tablet Take 400 mg by mouth every 8 (eight) hours as needed (pain.).    ? ? ?Results for orders placed or performed during the hospital encounter of 02/16/22 (from the past 48 hour(s))  ?Pregnancy, urine POC     Status: None  ? Collection Time: 02/16/22 12:18 PM  ?Result Value Ref Range  ? Preg Test, Ur NEGATIVE NEGATIVE  ?  Comment:        ?THE SENSITIVITY OF THIS ?METHODOLOGY IS >24 mIU/mL ?  ? ?No results found. ? ?Review of Systems ? ?Blood pressure (!) 128/98, pulse 88, resp. rate 17, height '5\' 9"'$  (1.753 m), weight 87.5 kg, last menstrual period 01/29/2022, SpO2 100 %. ?Physical Exam ?Vitals reviewed.  ?Constitutional:   ?   General: She is not in acute distress. ?   Appearance: Normal appearance.  ?HENT:  ?   Head: Normocephalic and atraumatic.  ?Eyes:  ?  General: No scleral icterus. ?   Conjunctiva/sclera: Conjunctivae normal.  ?Pulmonary:  ?   Effort: Pulmonary effort is normal. No respiratory distress.  ?Abdominal:  ?   General: There is no distension.  ?   Palpations: Abdomen is soft.  ?   Tenderness: There is no abdominal tenderness.  ?   Comments: Small supraumbilical hernia.  ?Musculoskeletal:     ?   General: Normal range of motion.  ?Skin: ?   General: Skin is warm and dry.  ?   Coloration: Skin is not jaundiced.  ?Neurological:  ?   General: No focal deficit present.  ?   Mental Status: She is alert and oriented to person, place, and time.  ?Psychiatric:     ?    Mood and Affect: Mood normal.     ?   Behavior: Behavior normal.     ?   Thought Content: Thought content normal.  ?  ? ?Assessment/Plan ?This is a 46 yo female with likely endometriosis involving the tip of the appendix, uterus and right ovary. Proceed to OR for laparoscopic appendectomy, joint case with Dr. Kennon Rounds for possible hysterectomy. I reviewed the details of appendectomy with the patient, and I will plan to use her supraumbilical hernia defect as a port site and close this primarily (there appears to be an endometrioma in the hernia on her MRI). Patient expressed understanding. Plan for discharge home postoperatively. ? ?Dwan Bolt, MD ?02/16/2022, 12:32 PM ? ? ? ?

## 2022-02-16 NOTE — Op Note (Addendum)
See separate note from Michaelle Birks, MD ?Preoperative diagnosis: appendiceal endometriosis ? ?Postoperative diagnosis: Same + pelvic adhesive disease ? ?Procedure: Diagnostic laparoscopy  ? ?Surgeon: Darron Doom, MD ? ?Assistant: Michaelle Birks, MD An experienced assistant was required given the standard of surgical care given the complexity of the case.  This assistant was needed for exposure, dissection, suctioning, retraction, instrument exchange, and for overall help during the procedure. ? ?Anesthesia: GETT - Albertha Ghee, MD ? ?Findings: Appendix adherent to the uterus, enlarged with cystic changes. Cystic pelvic peritoneum attached to uterus and tubes and ovaries. Adhesions of uterus to right pelvic side wall, diffuse endometriosis. Left tube and ovary are free. The right tube and ovary were buried under cystic peritoneum and could not be safely removed.  ? ?Estimated blood loss: Minimal ? ?Complications: None known ? ?Specimens: Appendix and endometriosis in supraumbilical hernia ? ?Disposition of Specimen:  Pathology ? ?Reason for procedure: 46 y.o. [id number] with h/o chronic abdominal pain and she had MRI revealing appendiceal endometriosis involving the tube and uterus. ? ?Procedure: Following Dr. Ayesha Rumpf removal of the appendix, attempted to see the right adnexa. The uterus was not very mobile and was attached to the right pelvic sidewall. The tube and round could be seen from the uterine origins and then disappeared into the uterine attachment to the side wall. The ovary was not seen with confidence. The patient strongly desired to leave all her pelvic organs intact. Given the danger of removing all of this and strong possibility of ureteral injury and the possibility of medical management, we decided to terminate the procedure.  All instrument, needle  and lap counts were correct x 2. The patient was awakened to recovery in stable condition. ? ?Standley Dakins PrattMD ?02/16/2022 ?2:25 PM ? ?

## 2022-02-17 ENCOUNTER — Encounter (HOSPITAL_COMMUNITY): Payer: Self-pay | Admitting: Surgery

## 2022-02-19 LAB — SURGICAL PATHOLOGY

## 2022-02-19 NOTE — Anesthesia Postprocedure Evaluation (Signed)
Anesthesia Post Note ? ?Patient: Jenna Heath ? ?Procedure(s) Performed: Diagnostic Laparoscopy with Appendectomy and Umbilical Hernia Repair ? ?  ? ?Patient location during evaluation: PACU ?Anesthesia Type: General ?Level of consciousness: awake and alert ?Pain management: pain level controlled ?Vital Signs Assessment: post-procedure vital signs reviewed and stable ?Respiratory status: spontaneous breathing, nonlabored ventilation, respiratory function stable and patient connected to nasal cannula oxygen ?Cardiovascular status: blood pressure returned to baseline and stable ?Postop Assessment: no apparent nausea or vomiting ?Anesthetic complications: no ? ? ?No notable events documented. ? ?Last Vitals:  ?Vitals:  ? 02/16/22 1545 02/16/22 1556  ?BP: (!) 145/100 (!) 146/96  ?Pulse: 81 84  ?Resp: 10 14  ?Temp:  36.7 ?C  ?SpO2: (!) 87% 97%  ?  ?Last Pain:  ?Vitals:  ? 02/16/22 1517  ?TempSrc:   ?PainSc: 7   ? ? ?  ?  ?  ?  ?  ?  ? ?Piedra Aguza S ? ? ? ? ?

## 2022-02-24 ENCOUNTER — Other Ambulatory Visit: Payer: Self-pay

## 2022-02-24 ENCOUNTER — Encounter (HOSPITAL_COMMUNITY): Payer: Self-pay | Admitting: Emergency Medicine

## 2022-02-24 ENCOUNTER — Emergency Department (HOSPITAL_COMMUNITY)
Admission: EM | Admit: 2022-02-24 | Discharge: 2022-02-24 | Disposition: A | Discharge status: Home / Self Care | Payer: Medicaid Other | Attending: Emergency Medicine | Admitting: Emergency Medicine

## 2022-02-24 DIAGNOSIS — Z79899 Other long term (current) drug therapy: Secondary | ICD-10-CM | POA: Diagnosis not present

## 2022-02-24 DIAGNOSIS — N809 Endometriosis, unspecified: Secondary | ICD-10-CM | POA: Insufficient documentation

## 2022-02-24 DIAGNOSIS — S3991XA Unspecified injury of abdomen, initial encounter: Secondary | ICD-10-CM | POA: Diagnosis present

## 2022-02-24 DIAGNOSIS — X58XXXA Exposure to other specified factors, initial encounter: Secondary | ICD-10-CM | POA: Diagnosis not present

## 2022-02-24 DIAGNOSIS — R1030 Lower abdominal pain, unspecified: Secondary | ICD-10-CM

## 2022-02-24 DIAGNOSIS — S31104A Unspecified open wound of abdominal wall, left lower quadrant without penetration into peritoneal cavity, initial encounter: Secondary | ICD-10-CM | POA: Diagnosis not present

## 2022-02-24 DIAGNOSIS — I10 Essential (primary) hypertension: Secondary | ICD-10-CM | POA: Insufficient documentation

## 2022-02-24 LAB — COMPREHENSIVE METABOLIC PANEL
ALT: 14 U/L (ref 0–44)
AST: 19 U/L (ref 15–41)
Albumin: 3.9 g/dL (ref 3.5–5.0)
Alkaline Phosphatase: 65 U/L (ref 38–126)
Anion gap: 10 (ref 5–15)
BUN: 5 mg/dL — ABNORMAL LOW (ref 6–20)
CO2: 21 mmol/L — ABNORMAL LOW (ref 22–32)
Calcium: 9.4 mg/dL (ref 8.9–10.3)
Chloride: 105 mmol/L (ref 98–111)
Creatinine, Ser: 0.77 mg/dL (ref 0.44–1.00)
GFR, Estimated: >60 mL/min (ref >60)
Glucose, Bld: 111 mg/dL — ABNORMAL HIGH (ref 70–99)
Potassium: 3.5 mmol/L (ref 3.5–5.1)
Sodium: 136 mmol/L (ref 135–145)
Total Bilirubin: 0.7 mg/dL (ref 0.3–1.2)
Total Protein: 7.6 g/dL (ref 6.5–8.1)

## 2022-02-24 LAB — URINALYSIS, ROUTINE W REFLEX MICROSCOPIC
Bacteria, UA: NONE SEEN
Bilirubin Urine: NEGATIVE
Glucose, UA: NEGATIVE mg/dL
Ketones, ur: 20 mg/dL — AB
Leukocytes,Ua: NEGATIVE
Nitrite: NEGATIVE
Protein, ur: NEGATIVE mg/dL
Specific Gravity, Urine: 1.017 (ref 1.005–1.030)
pH: 7 (ref 5.0–8.0)

## 2022-02-24 LAB — CBC
HCT: 42.7 % (ref 36.0–46.0)
Hemoglobin: 13.8 g/dL (ref 12.0–15.0)
MCH: 32.9 pg (ref 26.0–34.0)
MCHC: 32.3 g/dL (ref 30.0–36.0)
MCV: 101.9 fL — ABNORMAL HIGH (ref 80.0–100.0)
Platelets: 235 10*3/uL (ref 150–400)
RBC: 4.19 MIL/uL (ref 3.87–5.11)
RDW: 11.6 % (ref 11.5–15.5)
WBC: 6.9 10*3/uL (ref 4.0–10.5)
nRBC: 0 % (ref 0.0–0.2)

## 2022-02-24 LAB — LACTIC ACID, PLASMA
Lactic Acid, Venous: 1.3 mmol/L (ref 0.5–1.9)
Lactic Acid, Venous: 1.6 mmol/L (ref 0.5–1.9)

## 2022-02-24 LAB — I-STAT BETA HCG BLOOD, ED (MC, WL, AP ONLY): I-stat hCG, quantitative: <5 m[IU]/mL (ref <5)

## 2022-02-24 LAB — LIPASE, BLOOD: Lipase: 31 U/L (ref 11–51)

## 2022-02-24 MED ORDER — ONDANSETRON HCL 4 MG/2ML IJ SOLN
4.0000 mg | Freq: Once | INTRAMUSCULAR | Status: AC
  Administered 2022-02-24: 4 mg via INTRAVENOUS
  Filled 2022-02-24: qty 2

## 2022-02-24 MED ORDER — MORPHINE SULFATE (PF) 4 MG/ML IV SOLN
4.0000 mg | INTRAVENOUS | Status: DC | PRN
  Administered 2022-02-24: 4 mg via INTRAVENOUS
  Filled 2022-02-24: qty 1

## 2022-02-24 MED ORDER — MORPHINE SULFATE (PF) 4 MG/ML IV SOLN
4.0000 mg | Freq: Once | INTRAVENOUS | Status: AC
  Administered 2022-02-24: 4 mg via INTRAVENOUS
  Filled 2022-02-24: qty 1

## 2022-02-24 MED ORDER — KETOROLAC TROMETHAMINE 15 MG/ML IJ SOLN
15.0000 mg | Freq: Once | INTRAMUSCULAR | Status: AC
  Administered 2022-02-24: 15 mg via INTRAVENOUS
  Filled 2022-02-24: qty 1

## 2022-02-24 MED ORDER — LACTATED RINGERS IV BOLUS
1000.0000 mL | Freq: Once | INTRAVENOUS | Status: AC
  Administered 2022-02-24: 1000 mL via INTRAVENOUS

## 2022-02-24 NOTE — Discharge Instructions (Addendum)
Continue Tylenol and Naproxen as needed for pain as well as zofran as needed for nausea. Please return to the Emergency Department if you develop fever, worsening pain, or uncontrolled vomiting.

## 2022-02-24 NOTE — ED Notes (Signed)
Per Regenia Skeeter MD pt allowed to eat and drink. Pt son provided drink and popeyes for pt.

## 2022-02-24 NOTE — ED Triage Notes (Signed)
Pt reports having her appendix taken out Friday along with hernia repair.  States she was doing fine until this morning  Abdominal pain increasing all day as well as inability to hold anything down, including her pain medication.  States she has been vomiting all day.

## 2022-02-24 NOTE — ED Provider Notes (Signed)
[facility] DEPART[facility] Provider Note   CSN: [id number] Arrival date & time: 02/24/22  1740     History  Chief Complaint  Patient presents with   Abdominal Pain   Post-op Problem    Jenna Heath is a 46 y.o. female with a history of HTN and endometriosis presenting to the ED with nausea, vomiting, abdominal pain.  Patient reports acute onset nausea and vomiting with severe lower abdominal pain that started this morning.  She states that she has been unable to tolerate anything by mouth, including her nausea and pain medications so she came to the ED for further evaluation.  She states that this pain and vomiting is similar to her prior episodes of endometriosis.  She does state that she is currently on her menses.  Of note, patient had an appendectomy due to endometriosis within the appendix on 5/12 as well as excision of a endometrioma from a small supraumbilical hernia with closure of the hernia defect.  She has not had any fevers and states that her surgical sites are not tender.   Abdominal Pain Associated symptoms: nausea and vomiting   Associated symptoms: no chest pain, no dysuria, no fever and no shortness of breath       Home Medications Prior to Admission medications   Medication Sig Start Date End Date Taking? Authorizing Provider  hydrochlorothiazide (MICROZIDE) 12.5 MG capsule Take 12.5 mg by mouth daily at 12 noon.    [provider]  ibuprofen (ADVIL) 200 MG tablet Take 400 mg by mouth every 8 (eight) hours as needed (pain.).    [provider]  naproxen (NAPROSYN) 500 MG tablet Take 1 tablet (500 mg total) by mouth 2 (two) times daily as needed for moderate pain. Patient taking differently: Take 500 mg by mouth 2 (two) times daily as needed (menstrual pain.). 06/24/21   Redwine, Madison A, PA-C  ondansetron (ZOFRAN ODT) 4 MG disintegrating tablet Take 1 tablet (4 mg total) by mouth every 8 (eight) hours as needed for  nausea or vomiting. Patient taking differently: Take 4 mg by mouth every 8 (eight) hours as needed for nausea or vomiting (nausea associated with menstruation.). 06/24/21   Redwine, Madison A, PA-C  pravastatin (PRAVACHOL) 20 MG tablet Take 20 mg by mouth every evening. 10/09/21   [provider]      Allergies    Patient has no known allergies.    Review of Systems   Review of Systems  Constitutional:  Negative for fever.  Respiratory:  Negative for shortness of breath.   Cardiovascular:  Negative for chest pain.  Gastrointestinal:  Positive for abdominal pain, nausea and vomiting.  Genitourinary:  Negative for dysuria.  Neurological:  Negative for syncope.   Physical Exam Updated Vital Signs BP (!) 129/94   Pulse 84   Temp 98.9 F (37.2 C) (Oral)   Resp 13   LMP 01/29/2022 (Approximate)   SpO2 98%  Physical Exam Vitals and nursing note reviewed.  Constitutional:      General: She is in acute distress.     Appearance: She is not toxic-appearing or diaphoretic.     Comments: Uncomfortable appearing.  HENT:     Head: Normocephalic and atraumatic.     Right Ear: External ear normal.     Left Ear: External ear normal.     Nose: Nose normal.     Mouth/Throat:     Pharynx: Oropharynx is clear.  Eyes:     General: No scleral  icterus. Cardiovascular:     Rate and Rhythm: Normal rate and regular rhythm.     Heart sounds: Normal heart sounds. No murmur heard.   No friction rub. No gallop.  Pulmonary:     Effort: Pulmonary effort is normal. No respiratory distress.     Breath sounds: Normal breath sounds. No stridor. No wheezing, rhonchi or rales.  Abdominal:     General: There is no distension.     Palpations: Abdomen is soft.     Tenderness: There is abdominal tenderness in the suprapubic area. There is guarding (voluntary). There is no rebound. Negative signs include Murphy's sign, Rovsing's sign and McBurney's sign.     Comments: Surgical wounds over the umbilicus  and LLQ are well-approximated without surrounding erythema or drainage.  Musculoskeletal:        General: No deformity.     Right lower leg: No edema.     Left lower leg: No edema.  Skin:    General: Skin is warm and dry.  Neurological:     General: No focal deficit present.     Mental Status: She is alert and oriented to person, place, and time.    ED Results / Procedures / Treatments   Labs (all labs ordered are listed, but only abnormal results are displayed) Labs Reviewed  COMPREHENSIVE METABOLIC PANEL - Abnormal; Notable for the following components:      Result Value   CO2 21 (*)    Glucose, Bld 111 (*)    BUN 5 (*)    All other components within normal limits  CBC - Abnormal; Notable for the following components:   MCV 101.9 (*)    All other components within normal limits  URINALYSIS, ROUTINE W REFLEX MICROSCOPIC - Abnormal; Notable for the following components:   Hgb urine dipstick LARGE (*)    Ketones, ur 20 (*)    All other components within normal limits  LIPASE, BLOOD  LACTIC ACID, PLASMA  LACTIC ACID, PLASMA  I-STAT BETA HCG BLOOD, ED ([facility], [facility], [facility] ONLY)    EKG None  Radiology No results found.  Procedures Procedures   Medications Ordered in ED Medications  ondansetron (ZOFRAN) injection 4 mg (4 mg Intravenous Given 02/24/22 1843)  lactated ringers bolus 1,000 mL (0 mLs Intravenous Stopped 02/24/22 2106)  ketorolac (TORADOL) 15 MG/ML injection 15 mg (15 mg Intravenous Given 02/24/22 1844)  morphine (PF) 4 MG/ML injection 4 mg (4 mg Intravenous Given 02/24/22 1950)    ED Course/ Medical Decision Making/ A&P                           Medical Decision Making Amount and/or Complexity of Data Reviewed Labs: ordered.  Risk Prescription drug management.   Jenna Heath is a 46 y.o. female with a history of HTN and endometriosis presenting to the ED with nausea, vomiting, abdominal pain.  On exam, patient is afebrile and hemodynamically stable.  She  is very uncomfortable appearing on my exam and does have tenderness over the suprapubic area with voluntary guarding.  Her surgical sites are well approximated and do not appear infected and are nontender to palpation.  Patient states that her current presentation is consistent with her prior episodes of endometriosis.  She states that morphine typically improves the pain.  Patient given IV morphine, Toradol, and Zofran for pain and nausea control.  Beta-hCG is negative.  Lipase within normal limits.  CMP notable for bicarb of 21 but  otherwise unremarkable.  CBC without leukocytosis and with normal hemoglobin of 13.8.  Lactic acid is normal.  Given patient has had prior presentations very similar to this related to her endometriosis and the fact that she has not had a fever and does not have a white count, I have a lower suspicion for an intra-abdominal infection.  On reevaluation after the IV pain medication and IV nausea medicine, patient states that she feels much improved.  She continues to not have any focal tenderness over her surgical sites and reiterates that this feels like her endometriosis pain.  She has been able to tolerate water while in the ED and is requesting discharge at this time.  Patient has naproxen and Zofran at home for symptom control.  Strict return precautions were discussed and the patient was discharged home in stable condition.  Final Clinical Impression(s) / ED Diagnoses Final diagnoses:  Lower abdominal pain  Endometriosis    Rx / DC Orders ED Discharge Orders     None         Sondra Come, MD 02/24/22 4536    Sherwood Gambler, MD 03/01/22 [phone]

## 2022-02-25 ENCOUNTER — Emergency Department (HOSPITAL_COMMUNITY)
Admission: EM | Admit: 2022-02-25 | Discharge: 2022-02-25 | Disposition: A | Discharge status: Home / Self Care | Payer: Medicaid Other | Attending: Emergency Medicine | Admitting: Emergency Medicine

## 2022-02-25 ENCOUNTER — Encounter (HOSPITAL_COMMUNITY): Payer: Self-pay | Admitting: Emergency Medicine

## 2022-02-25 ENCOUNTER — Emergency Department (HOSPITAL_COMMUNITY): Payer: Medicaid Other

## 2022-02-25 DIAGNOSIS — R1031 Right lower quadrant pain: Secondary | ICD-10-CM | POA: Diagnosis present

## 2022-02-25 DIAGNOSIS — Z79899 Other long term (current) drug therapy: Secondary | ICD-10-CM | POA: Diagnosis not present

## 2022-02-25 DIAGNOSIS — R112 Nausea with vomiting, unspecified: Secondary | ICD-10-CM | POA: Insufficient documentation

## 2022-02-25 DIAGNOSIS — R109 Unspecified abdominal pain: Secondary | ICD-10-CM

## 2022-02-25 DIAGNOSIS — I10 Essential (primary) hypertension: Secondary | ICD-10-CM | POA: Insufficient documentation

## 2022-02-25 LAB — CBC WITH DIFFERENTIAL/PLATELET
Abs Immature Granulocytes: 0.01 10*3/uL (ref 0.00–0.07)
Basophils Absolute: 0 10*3/uL (ref 0.0–0.1)
Basophils Relative: 0 %
Eosinophils Absolute: 0 10*3/uL (ref 0.0–0.5)
Eosinophils Relative: 0 %
HCT: 39.1 % (ref 36.0–46.0)
Hemoglobin: 13.1 g/dL (ref 12.0–15.0)
Immature Granulocytes: 0 %
Lymphocytes Relative: 21 %
Lymphs Abs: 1.4 10*3/uL (ref 0.7–4.0)
MCH: 32.6 pg (ref 26.0–34.0)
MCHC: 33.5 g/dL (ref 30.0–36.0)
MCV: 97.3 fL (ref 80.0–100.0)
Monocytes Absolute: 0.5 10*3/uL (ref 0.1–1.0)
Monocytes Relative: 8 %
Neutro Abs: 4.7 10*3/uL (ref 1.7–7.7)
Neutrophils Relative %: 71 %
Platelets: 255 10*3/uL (ref 150–400)
RBC: 4.02 MIL/uL (ref 3.87–5.11)
RDW: 11.5 % (ref 11.5–15.5)
WBC: 6.6 10*3/uL (ref 4.0–10.5)
nRBC: 0 % (ref 0.0–0.2)

## 2022-02-25 LAB — COMPREHENSIVE METABOLIC PANEL
ALT: 13 U/L (ref 0–44)
AST: 21 U/L (ref 15–41)
Albumin: 3.6 g/dL (ref 3.5–5.0)
Alkaline Phosphatase: 59 U/L (ref 38–126)
Anion gap: 10 (ref 5–15)
BUN: <5 mg/dL — ABNORMAL LOW (ref 6–20)
CO2: 24 mmol/L (ref 22–32)
Calcium: 9 mg/dL (ref 8.9–10.3)
Chloride: 102 mmol/L (ref 98–111)
Creatinine, Ser: 0.83 mg/dL (ref 0.44–1.00)
GFR, Estimated: >60 mL/min (ref >60)
Glucose, Bld: 110 mg/dL — ABNORMAL HIGH (ref 70–99)
Potassium: 3.9 mmol/L (ref 3.5–5.1)
Sodium: 136 mmol/L (ref 135–145)
Total Bilirubin: 0.4 mg/dL (ref 0.3–1.2)
Total Protein: 7.2 g/dL (ref 6.5–8.1)

## 2022-02-25 IMAGING — CT CT ABD-PELV W/ CM
3 of 5 series · 10 of 46 positions shown, 15 images · IV contrast (APPLIED)
Comparison: [DATE]

CLINICAL DATA: Abdominal pain

EXAM:
CT ABDOMEN AND PELVIS WITH CONTRAST
TECHNIQUE: Multidetector CT imaging of the abdomen and pelvis was performed
using the standard protocol following bolus administration of
intravenous contrast.

[Series 3: abdomen 5.0 · axial · 0.84mm/px · z∈[-428,-68]mm · 6 of 102 slices shown, 11 images]
[im 15/102  soft-tissue]
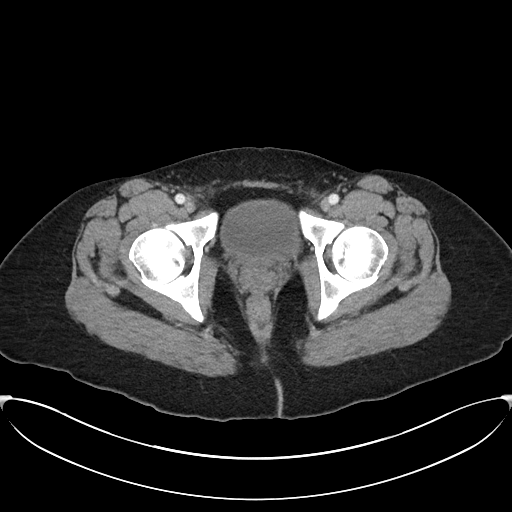
[im 15/102  bone]
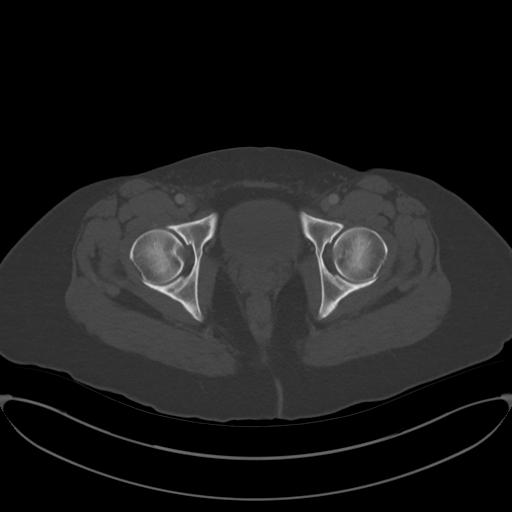
[im 29/102  soft-tissue]
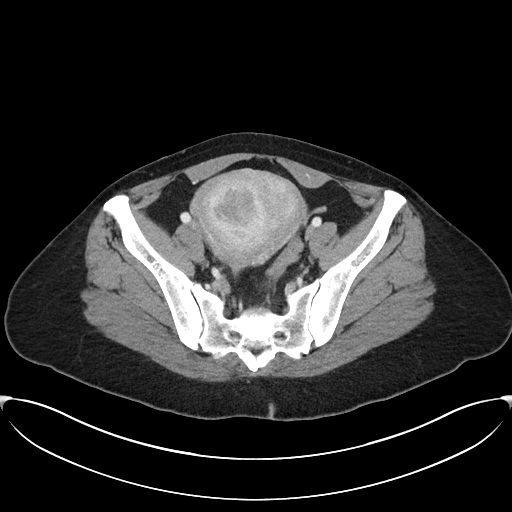
[im 44/102  soft-tissue]
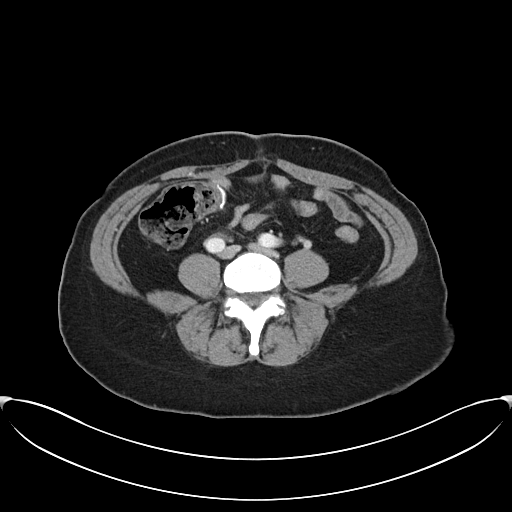
[im 44/102  lung]
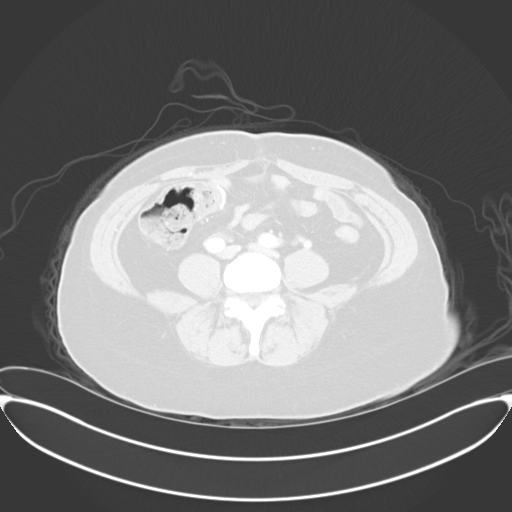
[im 58/102  soft-tissue]
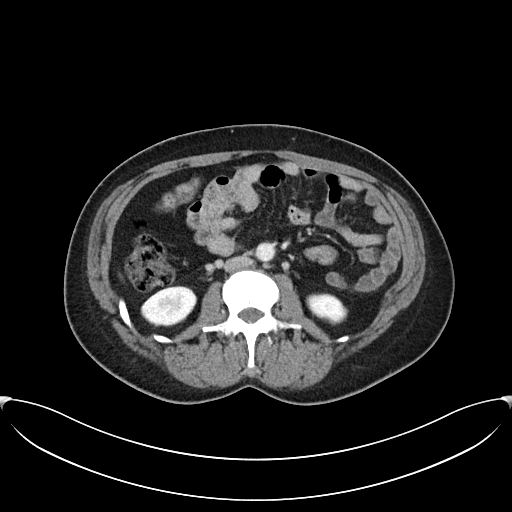
[im 58/102  lung]
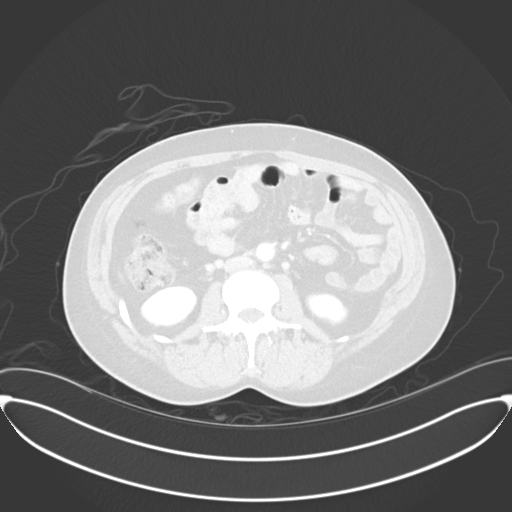
[im 73/102  soft-tissue]
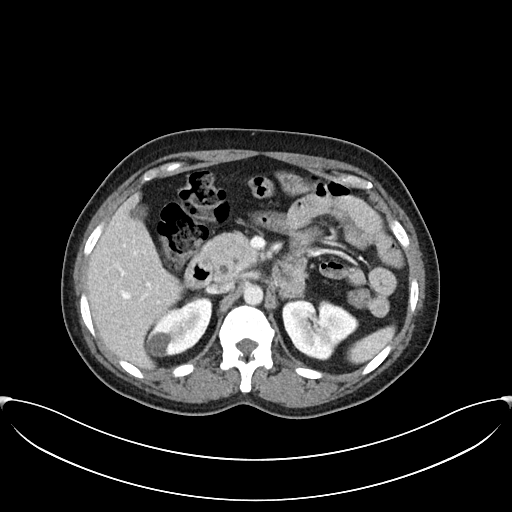
[im 73/102  lung]
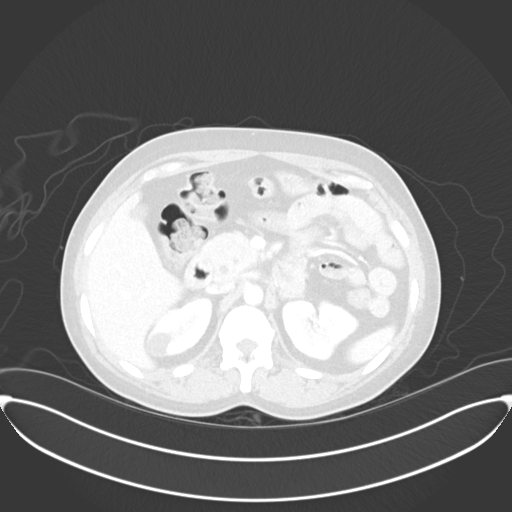
[im 87/102  soft-tissue]
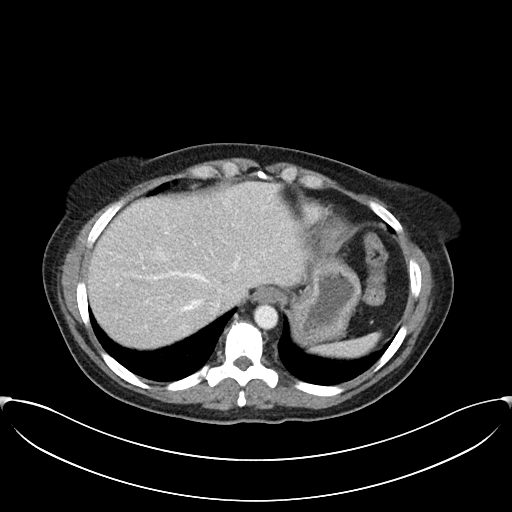
[im 87/102  lung]
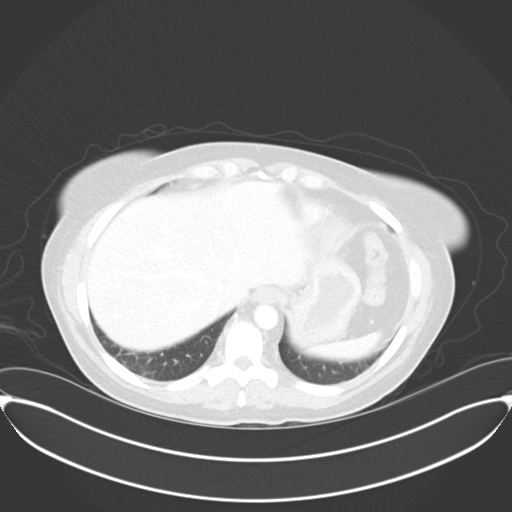

[Series 6: abdomen 3.0 mpr cor · coronal · 0.76mm/px · 3 of 87 slices shown]
[im 29/87  soft-tissue]
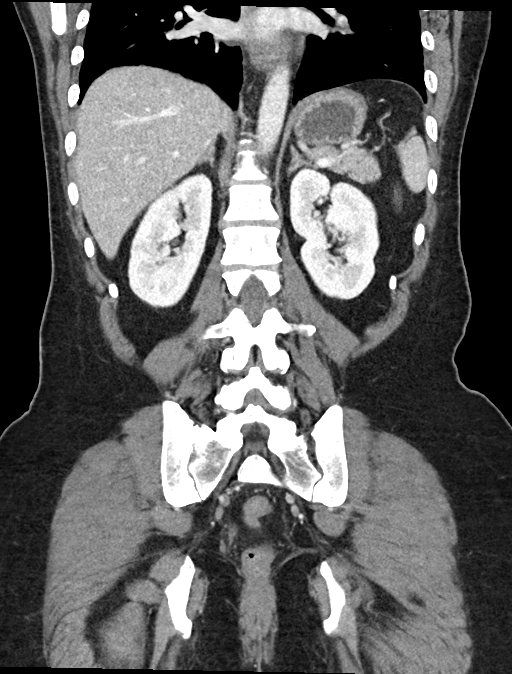
[im 39/87  soft-tissue]
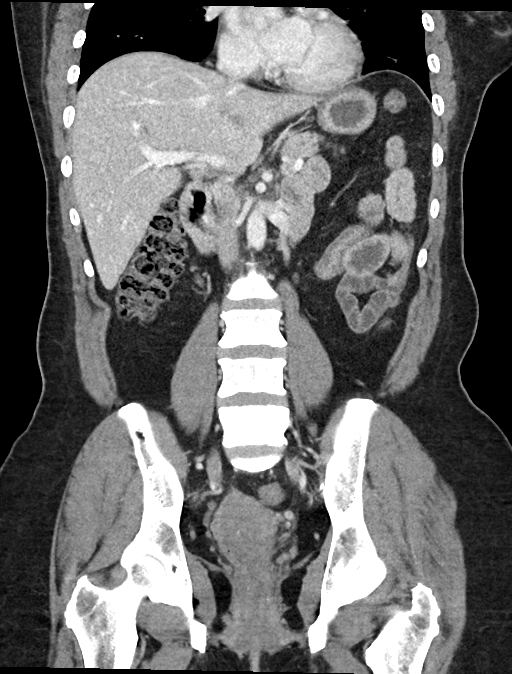
[im 48/87  soft-tissue]
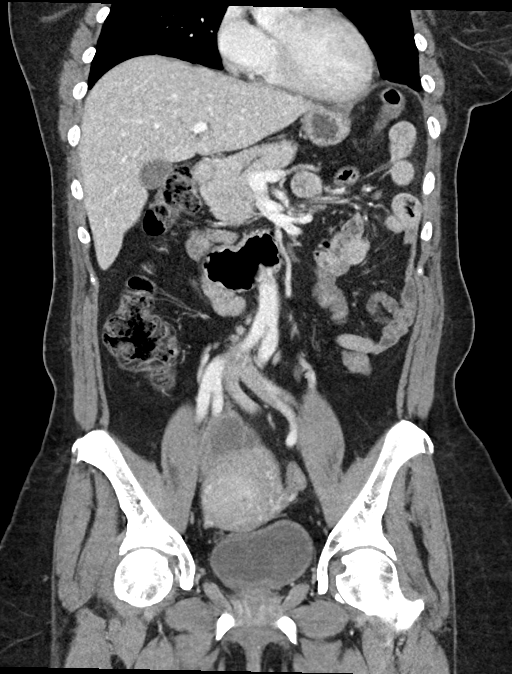

[Series 7: abdomen 3.0 mpr sag · sagittal · 0.51mm/px · 1 of 131 slices shown]
[im 44/131  soft-tissue]
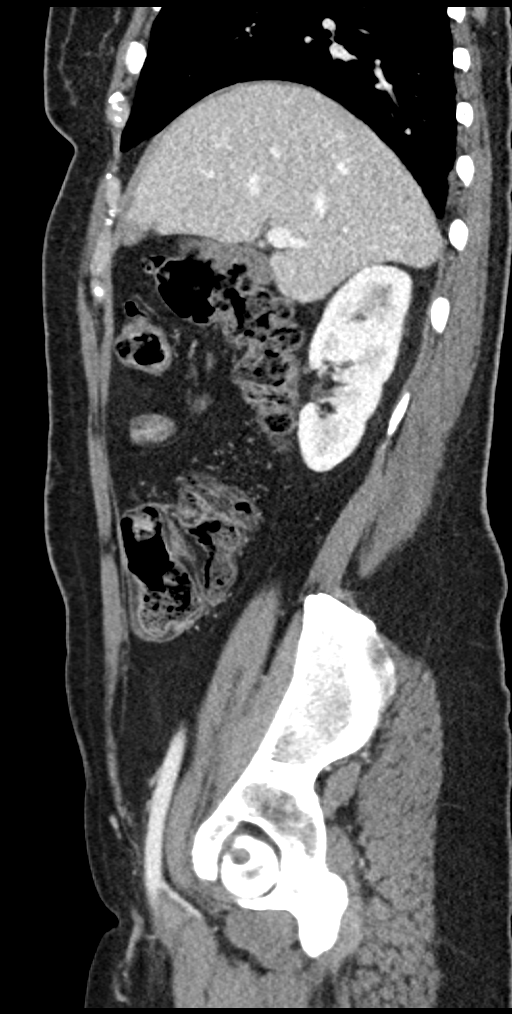

[10 of 46 positions shown; findings below may reference images not displayed]

RADIATION DOSE REDUCTION: This exam was performed according to the
departmental dose-optimization program which includes automated
exposure control, adjustment of the mA and/or kV according to
patient size and/or use of iterative reconstruction technique.

CONTRAST:  100mL OMNIPAQUE IOHEXOL 300 MG/ML  SOLN
FINDINGS: Lower chest: There is slight prominence of interstitial markings in
the periphery of lower lung fields, possibly minimal subsegmental
atelectasis or scarring.

Hepatobiliary: There is 2.3 cm low-density lesion with peripheral
nodular enhancement in the left lobe, possibly hemangioma. In image
17 of series 3, there is subcentimeter subtle low-density in the
lateral aspect of right lobe which could not be characterized. There
is no dilation of bile ducts. Gallbladder is unremarkable.

Pancreas: No focal abnormality is seen. There is mild dilation of
pancreatic duct.

Spleen: Unremarkable.

Adrenals/Urinary Tract: Adrenals are unremarkable. There is no
hydronephrosis. There are no renal or ureteral stones. There is
cm fluid density lesion in the lateral aspect of upper pole of right
kidney, possibly a cyst. Urinary bladder is unremarkable.

Stomach/Bowel: Stomach is not distended. Small bowel loops are not
dilated. Appendix is not seen. Surgical staples are noted in the tip
of cecum. There is no focal stranding or fluid collection in the
pericecal region. There is no significant wall thickening in colon.

Vascular/Lymphatic: Unremarkable.

Reproductive: Uterus is enlarged with inhomogeneous enhancement in
myometrium consistent with uterine fibroids. There are few smooth
marginated fluid density structures in the right adnexa largest
measuring 3 cm in diameter.

Other: There is no ascites or pneumoperitoneum. Umbilical hernia is
seen. There is 3.3 x 2.6 cm loculated fluid collection in the
umbilical hernia containing pockets of air. There is no significant
stranding in the adjacent fat planes.

Musculoskeletal: Unremarkable.
IMPRESSION: Status post appendectomy. There are no abnormal loculated fluid
collections in the pericecal region.

There is 3.3 cm fluid collection with pockets of air in the region
of the umbilical hernia. This may suggest postoperative seroma or an
abscess.

Possible 2.3 cm hemangioma is seen in the liver. Right renal cyst.
There are few smooth marginated cystic structures in the right
adnexa, possibly dominant follicles or functional cysts in the right
ovary.

Other findings as described in the body of the report.

## 2022-02-25 MED ORDER — TRAMADOL HCL 50 MG PO TABS: 50.0000 mg | ORAL_TABLET | Freq: Two times a day (BID) | ORAL | 0 refills | Status: DC | PRN

## 2022-02-25 MED ORDER — HYDROMORPHONE HCL 1 MG/ML IJ SOLN
1.0000 mg | Freq: Once | INTRAMUSCULAR | Status: AC
  Administered 2022-02-25: 1 mg via INTRAVENOUS
  Filled 2022-02-25: qty 1

## 2022-02-25 MED ORDER — OXYCODONE-ACETAMINOPHEN 5-325 MG PO TABS
1.0000 | ORAL_TABLET | Freq: Once | ORAL | Status: AC
  Administered 2022-02-25: 1 via ORAL
  Filled 2022-02-25: qty 1

## 2022-02-25 MED ORDER — LACTATED RINGERS IV BOLUS
1000.0000 mL | Freq: Once | INTRAVENOUS | Status: AC
  Administered 2022-02-25: 1000 mL via INTRAVENOUS

## 2022-02-25 MED ORDER — METOCLOPRAMIDE HCL 5 MG/ML IJ SOLN
10.0000 mg | Freq: Once | INTRAMUSCULAR | Status: AC
  Administered 2022-02-25: 10 mg via INTRAVENOUS
  Filled 2022-02-25: qty 2

## 2022-02-25 MED ORDER — IOHEXOL 300 MG/ML  SOLN
100.0000 mL | Freq: Once | INTRAMUSCULAR | Status: AC | PRN
  Administered 2022-02-25: 100 mL via INTRAVENOUS

## 2022-02-25 MED ORDER — ONDANSETRON HCL 4 MG PO TABS: 4.0000 mg | ORAL_TABLET | Freq: Three times a day (TID) | ORAL | 0 refills | Status: DC | PRN

## 2022-02-25 NOTE — ED Provider Notes (Signed)
46 yo female here with abdominal pain, vomiting Hx of severe endometriosis Hx of recent appendectomy   Labs unremarkable, CT today with no acute findings Pain improved with IV meds, still nauseated, will reassess after PO challenge.  *  Patient tolerated p.o., was able to eat a sandwich, wants to go home at this time, pain under control.  Okay for discharge    Wyvonnia Dusky, MD 02/25/22 1756

## 2022-02-25 NOTE — ED Triage Notes (Signed)
RLQ pain since yesterday morning with nausea and vomiting.  Seen in ED yesterday for same. Appendectomy and hernia repair on Friday.

## 2022-02-25 NOTE — Discharge Instructions (Signed)
Please follow-up with your gynecologist or primary physician about your ongoing abdominal pain and endometriosis.

## 2022-02-25 NOTE — ED Provider Notes (Signed)
[facility] EMERGENCY DEPARTMENT Provider Note   CSN: [id number] Arrival date & time: 02/25/22  1128     History  Chief Complaint  Patient presents with   Abdominal Pain    Jenna Heath is a 46 y.o. female.  Patient is a 46 year old female with a history of hypertension, endometriosis with severe recurrent abdominal pain with every menses who is presenting today with recurrent right-sided abdominal pain that sharp and searing in nature.  It causes nausea and vomiting and she has been unable to hold anything down.  She was seen in the emergency room yesterday for similar symptoms.  At that time she was improved after IV medications and went home but woke up this morning with similar symptoms.  She does report she is passing large clots and typically her symptoms only last today because she can come to the ER get medication and it resolves.  Her she had an appendectomy done 9 days ago and was supposed to have potential oophorectomy and hysterectomy but did not have that done.  She was feeling okay postop until yesterday when her menses started.  She denies any fevers but reports now she starting to get a headache and feeling lightheaded because she has not been able to hold anything down.  The history is provided by the patient.  Abdominal Pain     Home Medications Prior to Admission medications   Medication Sig Start Date End Date Taking? Authorizing Provider  hydrochlorothiazide (MICROZIDE) 12.5 MG capsule Take 12.5 mg by mouth daily at 12 noon.    [provider]  ibuprofen (ADVIL) 200 MG tablet Take 400 mg by mouth every 8 (eight) hours as needed (pain.).    [provider]  naproxen (NAPROSYN) 500 MG tablet Take 1 tablet (500 mg total) by mouth 2 (two) times daily as needed for moderate pain. Patient taking differently: Take 500 mg by mouth 2 (two) times daily as needed (menstrual pain.). 06/24/21   Redwine, Madison A, PA-C  ondansetron (ZOFRAN  ODT) 4 MG disintegrating tablet Take 1 tablet (4 mg total) by mouth every 8 (eight) hours as needed for nausea or vomiting. Patient taking differently: Take 4 mg by mouth every 8 (eight) hours as needed for nausea or vomiting (nausea associated with menstruation.). 06/24/21   Redwine, Madison A, PA-C  pravastatin (PRAVACHOL) 20 MG tablet Take 20 mg by mouth every evening. 10/09/21   [provider]      Allergies    Patient has no known allergies.    Review of Systems   Review of Systems  Gastrointestinal:  Positive for abdominal pain.   Physical Exam Updated Vital Signs BP 128/84   Pulse 87   Temp 99.3 F (37.4 C) (Oral)   Resp 17   LMP 01/29/2022 (Approximate)   SpO2 94%  Physical Exam Vitals and nursing note reviewed.  Constitutional:      General: She is not in acute distress.    Appearance: She is well-developed.  HENT:     Head: Normocephalic and atraumatic.     Mouth/Throat:     Mouth: Mucous membranes are dry.  Eyes:     Pupils: Pupils are equal, round, and reactive to light.  Cardiovascular:     Rate and Rhythm: Normal rate and regular rhythm.     Heart sounds: Normal heart sounds. No murmur heard.   No friction rub.  Pulmonary:     Effort: Pulmonary effort is normal.     Breath sounds:  Normal breath sounds. No wheezing or rales.  Abdominal:     General: Bowel sounds are normal. There is no distension.     Palpations: Abdomen is soft.     Tenderness: There is abdominal tenderness. There is no guarding or rebound.     Comments: Healing laparoscopy scars.  No drainage or dehiscence noted.  The swelling and induration around the surgical incision at the umbilicus.  Notable lower quadrant pain with palpation bilaterally  Musculoskeletal:        General: No tenderness. Normal range of motion.     Comments: No edema  Skin:    General: Skin is warm and dry.     Findings: No rash.  Neurological:     Mental Status: She is alert and oriented to person, place,  and time. Mental status is at baseline.     Cranial Nerves: No cranial nerve deficit.  Psychiatric:        Behavior: Behavior normal.    ED Results / Procedures / Treatments   Labs (all labs ordered are listed, but only abnormal results are displayed) Labs Reviewed  COMPREHENSIVE METABOLIC PANEL - Abnormal; Notable for the following components:      Result Value   Glucose, Bld 110 (*)    BUN <5 (*)    All other components within normal limits  CBC WITH DIFFERENTIAL/PLATELET    EKG None  Radiology CT ABDOMEN PELVIS W CONTRAST  Result Date: 02/25/2022 CLINICAL DATA:  Abdominal pain EXAM: CT ABDOMEN AND PELVIS WITH CONTRAST TECHNIQUE: Multidetector CT imaging of the abdomen and pelvis was performed using the standard protocol following bolus administration of intravenous contrast. RADIATION DOSE REDUCTION: This exam was performed according to the departmental dose-optimization program which includes automated exposure control, adjustment of the mA and/or kV according to patient size and/or use of iterative reconstruction technique. CONTRAST:  151m OMNIPAQUE IOHEXOL 300 MG/ML  SOLN COMPARISON:  10/28/2021 FINDINGS: Lower chest: There is slight prominence of interstitial markings in the periphery of lower lung fields, possibly minimal subsegmental atelectasis or scarring. Hepatobiliary: There is 2.3 cm low-density lesion with peripheral nodular enhancement in the left lobe, possibly hemangioma. In image 17 of series 3, there is subcentimeter subtle low-density in the lateral aspect of right lobe which could not be characterized. There is no dilation of bile ducts. Gallbladder is unremarkable. Pancreas: No focal abnormality is seen. There is mild dilation of pancreatic duct. Spleen: Unremarkable. Adrenals/Urinary Tract: Adrenals are unremarkable. There is no hydronephrosis. There are no renal or ureteral stones. There is 2.4 cm fluid density lesion in the lateral aspect of upper pole of right  kidney, possibly a cyst. Urinary bladder is unremarkable. Stomach/Bowel: Stomach is not distended. Small bowel loops are not dilated. Appendix is not seen. Surgical staples are noted in the tip of cecum. There is no focal stranding or fluid collection in the pericecal region. There is no significant wall thickening in colon. Vascular/Lymphatic: Unremarkable. Reproductive: Uterus is enlarged with inhomogeneous enhancement in myometrium consistent with uterine fibroids. There are few smooth marginated fluid density structures in the right adnexa largest measuring 3 cm in diameter. Other: There is no ascites or pneumoperitoneum. Umbilical hernia is seen. There is 3.3 x 2.6 cm loculated fluid collection in the umbilical hernia containing pockets of air. There is no significant stranding in the adjacent fat planes. Musculoskeletal: Unremarkable. IMPRESSION: Status post appendectomy. There are no abnormal loculated fluid collections in the pericecal region. There is 3.3 cm fluid collection with pockets of air  in the region of the umbilical hernia. This may suggest postoperative seroma or an abscess. Possible 2.3 cm hemangioma is seen in the liver. Right renal cyst. There are few smooth marginated cystic structures in the right adnexa, possibly dominant follicles or functional cysts in the right ovary. Other findings as described in the body of the report. Electronically Signed   By: Elmer Picker M.D.   On: 02/25/2022 13:52    Procedures Procedures    Medications Ordered in ED Medications  HYDROmorphone (DILAUDID) injection 1 mg (has no administration in time range)  oxyCODONE-acetaminophen (PERCOCET/ROXICET) 5-325 MG per tablet 1 tablet (has no administration in time range)  HYDROmorphone (DILAUDID) injection 1 mg (1 mg Intravenous Given 02/25/22 1228)  lactated ringers bolus 1,000 mL (1,000 mLs Intravenous New Bag/Given 02/25/22 1228)  metoCLOPramide (REGLAN) injection 10 mg (10 mg Intravenous Given  02/25/22 1228)  iohexol (OMNIPAQUE) 300 MG/ML solution 100 mL (100 mLs Intravenous Contrast Given 02/25/22 1323)    ED Course/ Medical Decision Making/ A&P                           Medical Decision Making Amount and/or Complexity of Data Reviewed Labs: ordered. Radiology: ordered.  Risk Prescription drug management.   Pt with multiple medical problems and comorbidities and presenting today with a complaint that caries a high risk for morbidity and mortality.  Presenting today with complaints of abdominal pain nausea and vomiting patient had abdominal surgery 9 days ago due to severe endometriosis, recurrent pain.  At that time she had a laparoscopy with appendix removal which was adhered to her uterus but based on op note she had significant cystic structures, scarring and they could not confidently identify her right ovary and felt that the risk was too high to remove that in her uterus and not injure the ureter.  They felt that her symptoms could be managed medically.  However patient started her menses yesterday and has had ongoing abdominal pain nausea and vomiting.  She was seen in the emergency room yesterday and treated symptomatically and initially felt better but reports when she woke up this morning the symptoms had returned.  She is unable to hold down any of her pain medication at home.  Unclear if this is patient's recurrent endometriosis type pain with known history and known anatomy based on recent surgery versus surgical complication.  Patient given pain control and fluids.  I independently interpreted her labs and CBC still remains normal today and CMP without acute findings.  CT is pending.  2:57 PM I have independently visualized and interpreted pt's images today. And CT did not show any sign of hydronephrosis or renal stones, radiology reports status post appendectomy with no abnormal loculated fluid collections.  There is a 3.3 cm fluid collection with pockets of air in the  region of the umbilical hernia which could be a postoperative seroma or abscess as well as a 2.3 cm hemangioma in the liver and a few smooth marginated cystic structures in the right adnexa possibly functional cyst or dominant follicle.  Low suspicion for abscess around the repaired umbilical hernia and suspect this is most likely seroma.  After the first dose of pain medication patient was feeling better but reports the pain is starting to return.  Suspect this is just ongoing pain from her endometriosis.  We will give a second dose of pain medication and ensure that patient is able to tolerate p.o.'s.  We will  discharge her home with pain medication and follow-up with Dr. Kennon Rounds to see further options for treatment of her endometriosis         Final Clinical Impression(s) / ED Diagnoses Final diagnoses:  None    Rx / DC Orders ED Discharge Orders     None         Blanchie Dessert, MD 02/25/22 1503

## 2022-02-26 ENCOUNTER — Telehealth: Payer: Self-pay | Admitting: Family Medicine

## 2022-02-26 MED ORDER — NORETHINDRONE ACETATE 5 MG PO TABS: 5.0000 mg | ORAL_TABLET | Freq: Every day | ORAL | 1 refills | Status: DC

## 2022-02-26 NOTE — Telephone Encounter (Signed)
Call received from patient. Pt states needing to know what is next steps regarding care after being in the hospital twice in 1 week.   Reviewed with Dr Kennon Rounds. Advised per Dr Kennon Rounds, We likely need to discuss next steps...including meds ---can give her a trial of Aygestin 5 mg daily with f/u in office.  Pt has f/u appt in office with Dr Kennon Rounds on 6/22. Rx Aygestin sent to pharmacy on file #[id number].  Also per Dr Kennon Rounds- also ensure she understands the why we didn't remove her ovary was because it is buried and we might have caused a major injury for her and we're hopeful the appendix removal would be enough, Since it isn't, we will begin meds to try to help her.  Pt given above recommendations per Dr Kennon Rounds. Pt had additional questions and Dr Kennon Rounds made aware.  Shelda Pal

## 2022-02-26 NOTE — Telephone Encounter (Signed)
Discussed surgical findings with patient and why we did not proceed with further removal of her organs and danger associated with this. Would prefer her to see minimally invasive surgeon if wanting hysterectomy. Given diffuseness of disease and her age, will attempt to suppress cycle with Aygestin.

## 2022-02-26 NOTE — Telephone Encounter (Signed)
Patient was in the hospital twice this week and would like a to speak to a clinical staff to be able to get in contact with Dr.Pratt

## 2022-03-29 ENCOUNTER — Telehealth: Payer: Self-pay

## 2022-03-29 ENCOUNTER — Telehealth (INDEPENDENT_AMBULATORY_CARE_PROVIDER_SITE_OTHER): Payer: Medicaid Other | Admitting: Family Medicine

## 2022-03-29 ENCOUNTER — Encounter: Payer: Self-pay | Admitting: Family Medicine

## 2022-03-29 DIAGNOSIS — N809 Endometriosis, unspecified: Secondary | ICD-10-CM | POA: Insufficient documentation

## 2022-03-29 MED ORDER — LUPRON DEPOT (3-MONTH) 11.25 MG IM KIT: 11.2500 mg | PACK | INTRAMUSCULAR | 1 refills | Status: DC

## 2022-03-29 MED ORDER — NAPROXEN 500 MG PO TABS: 500.0000 mg | ORAL_TABLET | Freq: Two times a day (BID) | ORAL | 2 refills | Status: DC | PRN

## 2022-03-29 MED ORDER — NORETHINDRONE ACETATE 5 MG PO TABS: 10.0000 mg | ORAL_TABLET | Freq: Every day | ORAL | 1 refills | Status: DC

## 2022-03-29 MED ORDER — TRAMADOL HCL 50 MG PO TABS: 50.0000 mg | ORAL_TABLET | Freq: Two times a day (BID) | ORAL | 1 refills | Status: DC | PRN

## 2022-03-29 NOTE — Progress Notes (Signed)
I connected with  Jenna Heath on 03/29/22 at  9:55 AM EDT by MyChart Virtual Video Visit and verified that I am speaking with the correct person using two identifiers.   I discussed the limitations, risks, security and privacy concerns of performing an evaluation and management service by telephone and the availability of in person appointments. I also discussed with the patient that there may be a patient responsible charge related to this service. The patient expressed understanding and agreed to proceed.  Mena Goes, CMA 03/29/2022  9:54 AM

## 2022-03-29 NOTE — Telephone Encounter (Signed)
Called pt to review Depo Lupron has been sent to pharmacy. Explained this may need prior authorization or to be ordered by pharmacy. Requested that patient contact our office once she has the medication in hand to schedule nurse visit for injection. Reviewed other medications sent to pharmacy should be ready today. Pt verbalizes understanding.

## 2022-03-29 NOTE — Assessment & Plan Note (Signed)
Refilled Naprosyn and Tramadol Will give her a shot of Depo Lupron and continue Aygestin for 2-3 weeks until this kicks in--then continue Lupron x 6 months and then resume aygestin until menopause if able--if not, consider referral to [facility].

## 2022-03-29 NOTE — Progress Notes (Signed)
Patient is 6 weeks post op from Laparoscopy with Appendectomy and Umbilical Hernia Repair surgery. She stated that she is still spotting "off and on" along with "some" pain.

## 2022-04-05 ENCOUNTER — Other Ambulatory Visit: Payer: Self-pay | Admitting: Family Medicine

## 2022-04-05 DIAGNOSIS — N809 Endometriosis, unspecified: Secondary | ICD-10-CM

## 2022-04-09 ENCOUNTER — Ambulatory Visit (INDEPENDENT_AMBULATORY_CARE_PROVIDER_SITE_OTHER): Payer: Medicaid Other

## 2022-04-09 ENCOUNTER — Other Ambulatory Visit: Payer: Self-pay

## 2022-04-09 VITALS — BP 131/78 | HR 92 | Wt 189.5 lb

## 2022-04-09 DIAGNOSIS — Z3202 Encounter for pregnancy test, result negative: Secondary | ICD-10-CM

## 2022-04-09 DIAGNOSIS — Z3042 Encounter for surveillance of injectable contraceptive: Secondary | ICD-10-CM

## 2022-04-09 DIAGNOSIS — N809 Endometriosis, unspecified: Secondary | ICD-10-CM

## 2022-04-09 LAB — POCT PREGNANCY, URINE: Preg Test, Ur: NEGATIVE

## 2022-04-09 MED ORDER — LEUPROLIDE ACETATE 3.75 MG IM KIT: 11.2500 mg | PACK | Freq: Once | INTRAMUSCULAR | Status: DC

## 2022-04-09 MED ORDER — LEUPROLIDE ACETATE (3 MONTH) 11.25 MG IM KIT
11.2500 mg | PACK | Freq: Once | INTRAMUSCULAR | Status: AC
  Administered 2022-04-09: 11.25 mg via INTRAMUSCULAR

## 2022-04-09 NOTE — Progress Notes (Signed)
Jenna Heath here today to begin Depo Lupron Injection. Patient denies having unprotected sex within the last two weeks. Urine pregnancy test negative. Injection administered without complication. Per Dr. Virginia Crews note patient to continue taking Aygestin for 2-3 weeks (until Depo Lupron kicks in) and then continue Depo Lupron x 6 months. I reviewed this with patient. Patient verbalized understanding. Patient will return in 3 months for next injection between September 18 and October 2nd. Next annual visit due March 2024.   Seth Bake, RN 04/09/2022

## 2022-04-12 ENCOUNTER — Ambulatory Visit: Payer: Medicaid Other

## 2022-04-30 ENCOUNTER — Other Ambulatory Visit: Payer: Self-pay | Admitting: Family Medicine

## 2022-04-30 ENCOUNTER — Telehealth: Payer: Self-pay

## 2022-04-30 DIAGNOSIS — N809 Endometriosis, unspecified: Secondary | ICD-10-CM

## 2022-04-30 MED ORDER — NORETHINDRONE ACETATE 5 MG PO TABS: 10.0000 mg | ORAL_TABLET | Freq: Every day | ORAL | 1 refills | Status: DC

## 2022-04-30 NOTE — Telephone Encounter (Addendum)
Received recommendation from Dr. Peggye Ley) 5 MG tablet two tabs until patient stops bleeding.  Pt verbalized understanding.   Jenna Heath

## 2022-06-13 ENCOUNTER — Encounter: Payer: Self-pay | Admitting: Internal Medicine

## 2022-06-21 ENCOUNTER — Other Ambulatory Visit: Payer: Self-pay

## 2022-06-21 ENCOUNTER — Ambulatory Visit (INDEPENDENT_AMBULATORY_CARE_PROVIDER_SITE_OTHER): Payer: Medicaid Other

## 2022-06-21 DIAGNOSIS — N938 Other specified abnormal uterine and vaginal bleeding: Secondary | ICD-10-CM

## 2022-06-21 NOTE — Progress Notes (Signed)
Pt here in office for Depot Lupron injection. Pt states she was told that we have Lupron in office for administration. Explained office does not stock Lupron, this must be picked up from local pharmacy and brought to appointment. Called pharmacy who states patient called there yesterday to request medication and was informed that it would need to be ordered. Pharmacy tech states order should be ready for pick up by 5 PM today. Reviewed this with patient who expresses frustration that she cannot receive injection today. I apologized to patient for inconvenience and offered to reschedule appointment for tomorrow or next week which patient declines. Explained patient may call office to reschedule.  Pt also reported heavy vaginal bleeding for past 2-3 weeks. States she is changing pad usually once an hour. Pt states she has continued to take Aygestin 5 mg daily while on Depot Lupron. Reviewed with Kennon Rounds who states patient may increase Aygestin to 10 mg daily to see if any improvement with bleeding.  Apolonio Schneiders RN 06/21/22

## 2022-06-25 ENCOUNTER — Ambulatory Visit: Payer: Medicaid Other

## 2022-07-04 ENCOUNTER — Other Ambulatory Visit: Payer: Self-pay

## 2022-07-04 ENCOUNTER — Ambulatory Visit (INDEPENDENT_AMBULATORY_CARE_PROVIDER_SITE_OTHER): Payer: Medicaid Other

## 2022-07-04 VITALS — BP 122/94 | HR 95 | Wt 186.1 lb

## 2022-07-04 DIAGNOSIS — N809 Endometriosis, unspecified: Secondary | ICD-10-CM

## 2022-07-04 MED ORDER — LEUPROLIDE ACETATE (3 MONTH) 11.25 MG IM KIT
11.2500 mg | PACK | Freq: Once | INTRAMUSCULAR | Status: AC
  Administered 2022-07-04: 11.25 mg via INTRAMUSCULAR

## 2022-07-04 NOTE — Progress Notes (Signed)
Jenna Heath here for Depo Lupron Injection.  Injection administered without complication. Pt reports new symptoms including hot flashes at night and increased clear vaginal discharge with no odor. Offered vaginal self swab, pt declined. Pt requesting to speak with Kennon Rounds, MD today. Explained she is not in office but I will reach out to her. Kennon Rounds, MD recommends pt schedule virtual appointment for conversation. [place] office schedules patient for 07/17/22.  Annabell Howells, RN 07/04/2022

## 2022-07-24 ENCOUNTER — Other Ambulatory Visit: Payer: Self-pay | Admitting: Family Medicine

## 2022-07-24 DIAGNOSIS — N809 Endometriosis, unspecified: Secondary | ICD-10-CM

## 2022-07-27 ENCOUNTER — Encounter: Payer: Self-pay | Admitting: Family Medicine

## 2022-07-27 ENCOUNTER — Telehealth (INDEPENDENT_AMBULATORY_CARE_PROVIDER_SITE_OTHER): Payer: Medicaid Other | Admitting: Family Medicine

## 2022-07-27 DIAGNOSIS — N80549 Endometriosis of the appendix, unspecified depth: Secondary | ICD-10-CM

## 2022-07-27 DIAGNOSIS — N809 Endometriosis, unspecified: Secondary | ICD-10-CM

## 2022-07-27 NOTE — Progress Notes (Signed)
    GYNECOLOGY VIRTUAL VISIT ENCOUNTER NOTE  Provider location: [facility]   Patient location: Home  I connected with Jenna Heath on 07/27/22 at  9:15 AM EDT by MyChart Video Encounter and verified that I am speaking with the correct person using two identifiers.   I discussed the limitations, risks, security and privacy concerns of performing an evaluation and management service virtually and the availability of in person appointments. I also discussed with the patient that there may be a patient responsible charge related to this service. The patient expressed understanding and agreed to proceed.   History:  Jenna Heath is a 46 y.o. [phone] female being evaluated today for follow-up. She is having heavy bleeding and cycle despite Depo Lupron. She has had some hot flashes She remains on Aygestin  Last cycle was last week. Lasting 5-7 days. Pain is much lower. She denies any abnormal vaginal discharge, or other concerns.       Past Medical History:  Diagnosis Date   Hypertension    Past Surgical History:  Procedure Laterality Date   CESAREAN SECTION     LAPAROSCOPIC APPENDECTOMY N/A 02/16/2022   Procedure: Diagnostic Laparoscopy with Appendectomy and Umbilical Hernia Repair;  Surgeon: Dwan Bolt, MD;  Location: [facility];  Service: General;  Laterality: N/A;   The following portions of the patient's history were reviewed and updated as appropriate: allergies, current medications, past family history, past medical history, past social history, past surgical history and problem list.   Health Maintenance:  Normal pap and negative HRHPV on 12/28/2021.  Normal mammogram on 11/09/2021.   Review of Systems:  Pertinent items noted in HPI and remainder of comprehensive ROS otherwise negative.  Physical Exam:   General:  Alert, oriented and cooperative. Patient appears to be in no acute distress.  Mental Status: Normal mood and affect.  Normal behavior. Normal judgment and thought content.   Respiratory: Normal respiratory effort, no problems with respiration noted  Rest of physical exam deferred due to type of encounter  Labs and Imaging No results found for this or any previous visit (from the past 336 hour(s)). No results found.     Assessment and Plan:     Problem List Items Addressed This Visit       Unprioritized   Appendiceal endometriosis - Primary    S/p removal.      Endometriosis    Pain is much improved. Still having cycles--will continue Aygestin bid--if pain is improved, may avoid surgery.             I discussed the assessment and treatment plan with the patient. The patient was provided an opportunity to ask questions and all were answered. The patient agreed with the plan and demonstrated an understanding of the instructions.   The patient was advised to call back or seek an in-person evaluation/go to the ED if the symptoms worsen or if the condition fails to improve as anticipated.  I provided 7 minutes of face-to-face time during this encounter.   Donnamae Jude, MD [facility], [place]

## 2022-07-27 NOTE — Assessment & Plan Note (Signed)
Pain is much improved. Still having cycles--will continue Aygestin bid--if pain is improved, may avoid surgery.

## 2022-07-27 NOTE — Assessment & Plan Note (Signed)
S/p removal.   

## 2022-07-30 ENCOUNTER — Telehealth: Payer: Self-pay | Admitting: General Practice

## 2022-07-30 ENCOUNTER — Telehealth: Payer: Self-pay | Admitting: Family Medicine

## 2022-07-30 NOTE — Telephone Encounter (Signed)
Patient had a Virtual visit last Friday,  She has been Bleeding very heavy and she is not sure what to do

## 2022-07-30 NOTE — Telephone Encounter (Signed)
Duplicate encounter

## 2022-07-30 NOTE — Telephone Encounter (Signed)
Called patient and she states she previously saw Dr Kennon Rounds on Friday 10/20 and later than night she started bleeding again. Patient states she had to sit on her toilet for a whole hour because that much blood was coming out. Patient reports very heavy bleeding at times with passing clots then it'll slow down for a while and start back up. She reports taking aygestin '10mg'$  daily. Told patient I would speak with Dr Kennon Rounds and then would call her back. Patient verbalized understanding.  Per Dr Kennon Rounds, patient can take aygestin '15mg'$ . Called patient and discussed dose increase with her and new Rx at pharmacy. Patient verbalized understanding and she states Dr Kennon Rounds didn't tell her she might have to increase this dose. Patient reports already taking depo lupron which she hasn't done before. Reports only taking depo provera in the past. She states she was supposed to have a hysterectomy earlier this year but her endometriosis was too out of control. Patient states Dr Kennon Rounds mentioned something about a specialist but doesn't know what is involved in that. Told patient I can message the front office about scheduling her an appt with the specialist and someone will call her back with the appt. Patient verbalized understanding.

## 2022-07-31 ENCOUNTER — Telehealth: Payer: Self-pay | Admitting: Family Medicine

## 2022-07-31 NOTE — Telephone Encounter (Signed)
Called patient to schedule appointment with Dr. [name], there was no answer to the phone call so a voicemail was left with the call back number and a letter was mailed.

## 2022-08-16 ENCOUNTER — Other Ambulatory Visit (HOSPITAL_COMMUNITY)
Admission: RE | Admit: 2022-08-16 | Discharge: 2022-08-16 | Disposition: A | Discharge status: Home / Self Care | Payer: Medicaid Other | Source: Ambulatory Visit | Attending: Obstetrics and Gynecology | Admitting: Obstetrics and Gynecology

## 2022-08-16 ENCOUNTER — Encounter: Payer: Self-pay | Admitting: Obstetrics and Gynecology

## 2022-08-16 ENCOUNTER — Ambulatory Visit (INDEPENDENT_AMBULATORY_CARE_PROVIDER_SITE_OTHER): Payer: Medicaid Other | Admitting: Obstetrics and Gynecology

## 2022-08-16 VITALS — BP 132/103 | Resp 16 | Ht 69.0 in | Wt 184.3 lb

## 2022-08-16 DIAGNOSIS — N80549 Endometriosis of the appendix, unspecified depth: Secondary | ICD-10-CM

## 2022-08-16 DIAGNOSIS — N809 Endometriosis, unspecified: Secondary | ICD-10-CM | POA: Diagnosis not present

## 2022-08-16 DIAGNOSIS — N898 Other specified noninflammatory disorders of vagina: Secondary | ICD-10-CM | POA: Insufficient documentation

## 2022-08-16 NOTE — Progress Notes (Addendum)
GYNECOLOGY PROGRESS NOTE  Subjective:    Patient ID: Jenna Heath, female    DOB: 08-21-1976, 46 y.o.   MRN: [id number]  HPI  Patient is a 46 y.o. [id number] female who presents for evaluation of heavy bleeding and endometriosis. She continues to take Aygestin for heavy bleeding. She does not think that the Depo Lupron is helping with the bleeding. She has not had any bleeding in the past 2 weeks. She quit her job 3 weeks ago and thinks that the bleeding was from stress related to her job. No pain during these last 2 weeks either. Reports when she as bleeding, it's typically without pain.  Confirms had an ablation in 2011.  Denies significant pain at this time - pain only with menses, none with BM, voiding, or intercourse.   Hystereectomy without BSO  - February preferred due to starting new job now   She also reports intermittent vaginal discharge, tan colored. No inciting event, just notes that it happens.   The following portions of the patient's history were reviewed and updated as appropriate: allergies, current medications, past family history, past medical history, past social history, past surgical history, and problem list.  Review of Systems Pertinent items are noted in HPI.   Objective:   Blood pressure (!) 132/103, resp. rate 16, height '5\' 9"'$  (1.753 m), weight 184 lb 4.8 oz (83.6 kg). Body mass index is 27.22 kg/m. General appearance: alert Heart: regular rate and rhythm Lungs: clear to auscultation bilaterally  Pelvic: deferred Extremities: extremities normal, atraumatic, no cyanosis or edema Neurologic: Grossly normal   Assessment:   1. Endometriosis   2. Appendiceal endometriosis   3. Vaginal discharge      Plan:   1. Endometriosis Pain well controlled with depo lupron. Pathological confirmation of endometriosis from laparoscopic appendectomy. Patient with prior endometrial ablation but with resumption of menses despite lupron and aygestin, although  bleeding has now seemed to stop. Discussed definitive management of AUB and dysmenorrhea with total hysterectomy +/- BSO - patient does not want the associated morbidity of BSO at this time and would prefer to just continue with hysterectomy. She states she is sure she would like a hysterectomy. Discussed possibility of continued pain following hysterectomy and will monitor if there is development of pain without menses. Reviewed risk of injury due to extent of endometriosis and/or scar tissue from prior procedures.   APAP makes her nauseous/vomit - nothing else bothers stomach. Postop analgesia - naproxen/tramadol prn oxycodone   Surgery to be tentatively scheduled for February as she is going to start a new job and not sure what the leave policy is at this time.   2. Appendiceal endometriosis S/p lsc appy  3. Vaginal discharge - Cervicovaginal ancillary only  Patient desires surgical management with RA-TLH, BS, cysto.  The risks of surgery were discussed in detail with the patient including but not limited to: bleeding which may require transfusion or reoperation; infection which may require prolonged hospitalization or re-hospitalization and antibiotic therapy; injury to bowel, bladder, ureters and major vessels or other surrounding organs which may lead to other procedures; formation of adhesions; need for additional procedures including laparotomy or subsequent procedures secondary to intraoperative injury or abnormal pathology; thromboembolic phenomenon; incisional problems and other postoperative or anesthesia complications.  Patient was told that the likelihood that her condition and symptoms will be treated effectively with this surgical management was high; the postoperative expectations were also discussed in detail.   The patient also understands the alternative  treatment options which were discussed in full. All questions were answered.  She was told that she will be contacted by our  surgical scheduler regarding the time and date of her surgery; routine preoperative instructions will be given to her by the preoperative nursing team.  Printed patient education handouts about the procedure were given to the patient to review at home.     Darliss Cheney, MD [place]

## 2022-08-16 NOTE — Patient Instructions (Signed)
We will plan for a robotic-assisted laparoscopic hysterectomy  - removal of uterus, tubes, and cervix. Your ovaries will stay in place.

## 2022-08-17 LAB — CERVICOVAGINAL ANCILLARY ONLY
Bacterial Vaginitis (gardnerella): POSITIVE — AB
Candida Glabrata: NEGATIVE
Candida Vaginitis: NEGATIVE
Comment: NEGATIVE
Comment: NEGATIVE
Comment: NEGATIVE

## 2022-08-20 ENCOUNTER — Telehealth: Payer: Self-pay | Admitting: Family Medicine

## 2022-08-20 MED ORDER — METRONIDAZOLE 500 MG PO TABS: 500.0000 mg | ORAL_TABLET | Freq: Two times a day (BID) | ORAL | 0 refills | Status: DC

## 2022-08-20 NOTE — Telephone Encounter (Signed)
Patient would like a nurse to call back and go over test results that were posted on Mychart

## 2022-08-20 NOTE — Telephone Encounter (Signed)
Pt presented to office and requested to speak with a nurse. I explained her results of BV to her satisfaction. Rx sent to pharmacy and dose instructions were given. Pt expressed gratitude and had no questions.

## 2022-09-04 ENCOUNTER — Telehealth: Payer: Self-pay | Admitting: Family Medicine

## 2022-09-04 ENCOUNTER — Other Ambulatory Visit: Payer: Self-pay

## 2022-09-04 DIAGNOSIS — N809 Endometriosis, unspecified: Secondary | ICD-10-CM

## 2022-09-04 MED ORDER — LUPRON DEPOT (3-MONTH) 11.25 MG IM KIT: 11.2500 mg | PACK | INTRAMUSCULAR | 0 refills | Status: DC

## 2022-09-04 NOTE — Telephone Encounter (Signed)
Patient wanted to see if her depo prescription could be sent to her pharmacy a few days early before her appointment so she makes sure she has it on time.

## 2022-09-12 ENCOUNTER — Telehealth: Payer: Self-pay

## 2022-09-12 NOTE — Telephone Encounter (Addendum)
-----   Message from Francia Greaves sent at 09/07/2022  2:54 PM EST ----- Regarding: NEED HYSTERECTOMY STATEMENT - SURGERY 2/7  Left message stating that we need her to sign a form prior to her surgery if she could please give the office a call back.   Mel Almond, RN  09/12/22

## 2022-09-17 NOTE — Telephone Encounter (Signed)
Patient has appt in office 12/13- will sign at that time.

## 2022-09-19 ENCOUNTER — Other Ambulatory Visit: Payer: Self-pay

## 2022-09-19 ENCOUNTER — Ambulatory Visit (INDEPENDENT_AMBULATORY_CARE_PROVIDER_SITE_OTHER): Payer: Medicaid Other

## 2022-09-19 DIAGNOSIS — Z3042 Encounter for surveillance of injectable contraceptive: Secondary | ICD-10-CM

## 2022-09-19 MED ORDER — LEUPROLIDE ACETATE 3.75 MG IM KIT: 3.7500 mg | PACK | Freq: Once | INTRAMUSCULAR | Status: DC

## 2022-09-19 MED ORDER — LEUPROLIDE ACETATE (3 MONTH) 11.25 MG IM KIT
11.2500 mg | PACK | Freq: Once | INTRAMUSCULAR | Status: AC
  Administered 2022-09-19: 11.25 mg via INTRAMUSCULAR

## 2022-09-19 MED ORDER — LUPRON DEPOT (3-MONTH) 11.25 MG IM KIT: 11.2500 mg | PACK | INTRAMUSCULAR | 0 refills | Status: DC

## 2022-09-19 NOTE — Addendum Note (Signed)
Addended by: Madalyn Rob D on: 09/19/2022 08:52 AM   Modules accepted: Orders

## 2022-09-19 NOTE — Progress Notes (Addendum)
Jenna Heath here for [name] Injection. Injection administered without complication. Patient will return in 3 months for next injection between 02/28 and 03/14. Next annual visit due March 2024.   Patient is currently scheduled for hysterectomy with Dr. [name] 02/07. She states she has recently started a new job and is unsure if she will be able to take time off for the surgery. I offered her to sign Hysterectomy Statement at this time and patient declined as she is not sure if she will follow through with surgery or not.   Darlyne Russian, RN 09/19/2022  8:23 AM

## 2022-09-24 ENCOUNTER — Other Ambulatory Visit: Payer: Self-pay | Admitting: Obstetrics and Gynecology

## 2022-09-24 ENCOUNTER — Other Ambulatory Visit: Payer: Self-pay | Admitting: *Deleted

## 2022-09-24 ENCOUNTER — Encounter: Payer: Self-pay | Admitting: Obstetrics and Gynecology

## 2022-09-24 DIAGNOSIS — N809 Endometriosis, unspecified: Secondary | ICD-10-CM

## 2022-09-24 MED ORDER — NORETHINDRONE ACETATE 5 MG PO TABS: 15.0000 mg | ORAL_TABLET | Freq: Every day | ORAL | 0 refills | Status: DC

## 2022-09-24 NOTE — Progress Notes (Signed)
Fax request for refill of Norethindrone 5 mg received from [facility]. Rx approved by Dr. [name] and e-prescribed.

## 2022-10-04 ENCOUNTER — Telehealth: Payer: Self-pay

## 2022-10-04 NOTE — Telephone Encounter (Signed)
-----   Message from Francia Greaves sent at 10/04/2022  9:58 AM EST ----- Regarding: Needs Hysterectomy Statement Per Dr. [name], if she does not sign a hysterectomy statement by tomorrow, her surgery will be cancelled.

## 2022-10-04 NOTE — Telephone Encounter (Signed)
Call placed to pt. Pt did not answer. Left detailed message with instructions to come to office between now and 12/29 at 12noon to sign hysterectomy statement. Pt advised to call or send mychart message with any questions or concerns. Pt also advised if not signed, surgery will be cancelled.  Colletta Maryland, RNC

## 2022-10-05 NOTE — Telephone Encounter (Signed)
Call placed back to pt. Pt states got my VM and has not spoken to her new bosses yet due to the holidays. They will be back in the office next week. Pt states unsure if she can keep the 11/14/22 surgery date due to starting her new job in Nov 2023. Pt advised to call back to office starting 10/09/22 to speak with office staff or Drema Balzarine to give update on surgery.  Will send encounter to Dr [name] and Drema Balzarine.  Colletta Maryland, RNC

## 2022-10-16 ENCOUNTER — Telehealth: Payer: Self-pay

## 2022-10-16 NOTE — Telephone Encounter (Signed)
Called patient requesting update on her FMLA status for her surgery. I requested the patient call us back ASAP.

## 2022-10-24 ENCOUNTER — Telehealth: Payer: Self-pay

## 2022-10-24 NOTE — Telephone Encounter (Signed)
Called patient to discuss her intentions for having surgery patient states she called on Friday and spoke with a female and told him to cancel her surgery. Informed patient that she spoke with our after-hours answering service because no males work here and after hours answering service cannot make or cancel any appointments.  Patient states she cannot have surgery on 2/7 because her job is still too new. Advised patient when she is ready to be rescheduled to contact the office to make an appointment with Dr. [name]. Patient expressed understanding.

## 2022-11-06 ENCOUNTER — Other Ambulatory Visit: Payer: Self-pay | Admitting: Obstetrics and Gynecology

## 2022-11-06 DIAGNOSIS — N809 Endometriosis, unspecified: Secondary | ICD-10-CM

## 2022-11-06 MED ORDER — NORETHINDRONE ACETATE 5 MG PO TABS: 15.0000 mg | ORAL_TABLET | Freq: Every day | ORAL | 0 refills | Status: DC

## 2022-11-14 ENCOUNTER — Ambulatory Visit (HOSPITAL_BASED_OUTPATIENT_CLINIC_OR_DEPARTMENT_OTHER): Admit: 2022-11-14 | Payer: Medicaid Other | Admitting: Obstetrics and Gynecology

## 2022-11-14 ENCOUNTER — Encounter (HOSPITAL_BASED_OUTPATIENT_CLINIC_OR_DEPARTMENT_OTHER): Payer: Self-pay

## 2022-11-14 SURGERY — HYSTERECTOMY, TOTAL, ROBOT-ASSISTED
Anesthesia: Choice

## 2022-11-27 ENCOUNTER — Other Ambulatory Visit: Payer: Self-pay

## 2022-11-27 DIAGNOSIS — N809 Endometriosis, unspecified: Secondary | ICD-10-CM

## 2022-11-27 MED ORDER — LUPRON DEPOT (3-MONTH) 11.25 MG IM KIT: 11.2500 mg | PACK | INTRAMUSCULAR | 1 refills | Status: DC

## 2022-12-05 ENCOUNTER — Ambulatory Visit: Payer: Medicaid Other

## 2022-12-06 ENCOUNTER — Other Ambulatory Visit: Payer: Self-pay | Admitting: Obstetrics and Gynecology

## 2022-12-06 ENCOUNTER — Ambulatory Visit (INDEPENDENT_AMBULATORY_CARE_PROVIDER_SITE_OTHER): Payer: Medicaid Other | Admitting: *Deleted

## 2022-12-06 ENCOUNTER — Other Ambulatory Visit: Payer: Self-pay

## 2022-12-06 ENCOUNTER — Ambulatory Visit: Payer: Medicaid Other

## 2022-12-06 VITALS — BP 128/93 | HR 96 | Ht 68.0 in | Wt 191.4 lb

## 2022-12-06 DIAGNOSIS — N809 Endometriosis, unspecified: Secondary | ICD-10-CM

## 2022-12-06 DIAGNOSIS — N80549 Endometriosis of the appendix, unspecified depth: Secondary | ICD-10-CM

## 2022-12-06 MED ORDER — LEUPROLIDE ACETATE 3.75 MG IM KIT: 11.2500 mg | PACK | Freq: Once | INTRAMUSCULAR | Status: DC

## 2022-12-06 MED ORDER — LEUPROLIDE ACETATE (3 MONTH) 11.25 MG IM KIT
11.2500 mg | PACK | Freq: Once | INTRAMUSCULAR | Status: AC
  Administered 2022-12-06: 11.25 mg via INTRAMUSCULAR

## 2022-12-06 NOTE — Progress Notes (Signed)
Here for Lupron. Per Dr. [name] this is last Lupron she can get.Plan is to continue taking Aygestin and reschedule surgery when patient ready. Explained to patient this is last Lupron and when she is ready to have surgery to call for appointment with Dr. [name] and then surgery will be rescheduled. She states she plans to get surgery once she has worked 6 months and can get paid leave. Advised to call 2 months ahead to schedule visit.  Injection given without complaint. Staci Acosta

## 2023-01-24 ENCOUNTER — Other Ambulatory Visit: Payer: Self-pay

## 2023-01-24 DIAGNOSIS — N809 Endometriosis, unspecified: Secondary | ICD-10-CM

## 2023-01-24 MED ORDER — NORETHINDRONE ACETATE 5 MG PO TABS: ORAL_TABLET | ORAL | 5 refills | Status: DC

## 2023-01-24 MED ORDER — NORETHINDRONE ACETATE 5 MG PO TABS: ORAL_TABLET | ORAL | 0 refills | Status: DC

## 2023-01-24 NOTE — Progress Notes (Unsigned)
Jenna Shire, MD  Marjo Bicker, RN Agree ok to continue the norethindrone  and she'll need to let us know when she has the time for the hysterectomy and will schedule them. If the norethindrone is holding her she doesn't need additional visits for refills  Reordered month supply with 5 refills, appt cancelled, pt notified via MyChart.

## 2023-01-24 NOTE — Progress Notes (Unsigned)
Patient came into front office to request refill of Norethindrone. Per chart review of note 12/06/22 plan is for d/c of Lupron and continued norethindrone until hysterectomy an option for patient. Refill ordered. Will ask Ajewole MD if follow up appt needed or if refills can be given for 6 months until pt able to schedule hyst.

## 2023-02-04 ENCOUNTER — Ambulatory Visit: Payer: Medicaid Other | Admitting: Obstetrics and Gynecology

## 2023-07-08 DIAGNOSIS — Z6830 Body mass index (BMI) 30.0-30.9, adult: Secondary | ICD-10-CM | POA: Diagnosis not present

## 2023-07-08 DIAGNOSIS — Z0000 Encounter for general adult medical examination without abnormal findings: Secondary | ICD-10-CM | POA: Diagnosis not present

## 2023-07-08 DIAGNOSIS — R030 Elevated blood-pressure reading, without diagnosis of hypertension: Secondary | ICD-10-CM | POA: Diagnosis not present

## 2023-07-08 DIAGNOSIS — Z114 Encounter for screening for human immunodeficiency virus [HIV]: Secondary | ICD-10-CM | POA: Diagnosis not present

## 2023-07-08 DIAGNOSIS — R5383 Other fatigue: Secondary | ICD-10-CM | POA: Diagnosis not present

## 2023-07-08 DIAGNOSIS — E7800 Pure hypercholesterolemia, unspecified: Secondary | ICD-10-CM | POA: Diagnosis not present

## 2023-07-08 DIAGNOSIS — I10 Essential (primary) hypertension: Secondary | ICD-10-CM | POA: Diagnosis not present

## 2023-07-08 DIAGNOSIS — E559 Vitamin D deficiency, unspecified: Secondary | ICD-10-CM | POA: Diagnosis not present

## 2023-07-08 DIAGNOSIS — Z131 Encounter for screening for diabetes mellitus: Secondary | ICD-10-CM | POA: Diagnosis not present

## 2023-10-30 NOTE — Progress Notes (Unsigned)
    GYNECOLOGY VISIT  Patient name: Jenna Heath MRN [id number]  Date of birth: 18-Mar-1976 Chief Complaint:   No chief complaint on file.   History:  Jenna Heath is a 48 y.o. [phone] being seen today for ***.    Last visi 08/2022: AUB and endometriosis with desire for hysterectomy. Endometriosis previously confirmed on lsc appy (endometriosis noted in hernia contents and appendix)  Past Medical History:  Diagnosis Date   Hypertension     Past Surgical History:  Procedure Laterality Date   CESAREAN SECTION     LAPAROSCOPIC APPENDECTOMY N/A 02/16/2022   Procedure: Diagnostic Laparoscopy with Appendectomy and Umbilical Hernia Repair;  Surgeon: Fritzi Mandes, MD;  Location: [facility] OR;  Service: General;  Laterality: N/A;    The following portions of the patient's history were reviewed and updated as appropriate: allergies, current medications, past family history, past medical history, past social history, past surgical history and problem list.   Health Maintenance:   Last pap ***. Results were: {Pap findings:25134}. H/O abnormal pap: {yes/yes***/no:23866} Last mammogram: ***. Results were: {normal, abnormal, n/a:23837}. Family h/o breast cancer: {yes***/no:23838}   Review of Systems:  {Ros - complete:30496} Comprehensive review of systems was otherwise negative.   Objective:  Physical Exam There were no vitals taken for this visit.   Physical Exam   Labs and Imaging No results found.     Assessment & Plan:   There are no diagnoses linked to this encounter.   *** Routine preventative health maintenance measures emphasized.  Lorriane Shire, MD [facility], [facility]

## 2023-10-31 ENCOUNTER — Other Ambulatory Visit: Payer: Self-pay

## 2023-10-31 ENCOUNTER — Other Ambulatory Visit (HOSPITAL_COMMUNITY)
Admission: RE | Admit: 2023-10-31 | Discharge: 2023-10-31 | Disposition: A | Discharge status: Home / Self Care | Payer: BC Managed Care – PPO | Source: Ambulatory Visit | Attending: Obstetrics and Gynecology | Admitting: Obstetrics and Gynecology

## 2023-10-31 ENCOUNTER — Ambulatory Visit: Payer: BC Managed Care – PPO | Admitting: Obstetrics and Gynecology

## 2023-10-31 ENCOUNTER — Encounter: Payer: Self-pay | Admitting: Obstetrics and Gynecology

## 2023-10-31 VITALS — BP 157/91 | HR 92 | Wt 210.0 lb

## 2023-10-31 DIAGNOSIS — N760 Acute vaginitis: Secondary | ICD-10-CM | POA: Diagnosis not present

## 2023-10-31 DIAGNOSIS — Z1331 Encounter for screening for depression: Secondary | ICD-10-CM | POA: Diagnosis not present

## 2023-10-31 DIAGNOSIS — B9689 Other specified bacterial agents as the cause of diseases classified elsewhere: Secondary | ICD-10-CM

## 2023-10-31 DIAGNOSIS — Z30430 Encounter for insertion of intrauterine contraceptive device: Secondary | ICD-10-CM

## 2023-10-31 DIAGNOSIS — N809 Endometriosis, unspecified: Secondary | ICD-10-CM | POA: Diagnosis not present

## 2023-10-31 DIAGNOSIS — N898 Other specified noninflammatory disorders of vagina: Secondary | ICD-10-CM | POA: Insufficient documentation

## 2023-10-31 DIAGNOSIS — N939 Abnormal uterine and vaginal bleeding, unspecified: Secondary | ICD-10-CM

## 2023-10-31 DIAGNOSIS — Z3202 Encounter for pregnancy test, result negative: Secondary | ICD-10-CM | POA: Diagnosis not present

## 2023-10-31 DIAGNOSIS — Z975 Presence of (intrauterine) contraceptive device: Secondary | ICD-10-CM | POA: Insufficient documentation

## 2023-10-31 DIAGNOSIS — R102 Pelvic and perineal pain: Secondary | ICD-10-CM

## 2023-10-31 DIAGNOSIS — G8929 Other chronic pain: Secondary | ICD-10-CM

## 2023-10-31 LAB — POCT PREGNANCY, URINE: Preg Test, Ur: NEGATIVE

## 2023-10-31 MED ORDER — MEGESTROL ACETATE 40 MG PO TABS: 40.0000 mg | ORAL_TABLET | Freq: Two times a day (BID) | ORAL | 5 refills | Status: DC

## 2023-10-31 MED ORDER — METRONIDAZOLE 0.75 % VA GEL: 1.0000 | Freq: Every day | VAGINAL | 1 refills | Status: DC

## 2023-10-31 MED ORDER — LEVONORGESTREL 20 MCG/DAY IU IUD
1.0000 | INTRAUTERINE_SYSTEM | Freq: Once | INTRAUTERINE | Status: AC
  Administered 2023-10-31: 1 via INTRAUTERINE

## 2023-10-31 NOTE — Patient Instructions (Signed)
Vaginal boric acid, 600mg  before going to sleep for abnormal discharge

## 2023-11-01 ENCOUNTER — Encounter: Payer: Self-pay | Admitting: Obstetrics and Gynecology

## 2023-11-01 LAB — CERVICOVAGINAL ANCILLARY ONLY
Bacterial Vaginitis (gardnerella): NEGATIVE
Candida Glabrata: NEGATIVE
Candida Vaginitis: NEGATIVE
Chlamydia: NEGATIVE
Comment: NEGATIVE
Comment: NEGATIVE
Comment: NEGATIVE
Comment: NEGATIVE
Comment: NEGATIVE
Comment: NORMAL
Neisseria Gonorrhea: NEGATIVE
Trichomonas: NEGATIVE

## 2023-11-28 ENCOUNTER — Encounter: Payer: Self-pay | Admitting: General Practice

## 2024-03-05 ENCOUNTER — Ambulatory Visit (INDEPENDENT_AMBULATORY_CARE_PROVIDER_SITE_OTHER): Admitting: Student in an Organized Health Care Education/Training Program

## 2024-03-05 ENCOUNTER — Encounter: Payer: Self-pay | Admitting: Student in an Organized Health Care Education/Training Program

## 2024-03-05 VITALS — BP 137/98 | HR 108 | Wt 219.0 lb

## 2024-03-05 DIAGNOSIS — D500 Iron deficiency anemia secondary to blood loss (chronic): Secondary | ICD-10-CM | POA: Diagnosis not present

## 2024-03-05 DIAGNOSIS — E7849 Other hyperlipidemia: Secondary | ICD-10-CM | POA: Diagnosis not present

## 2024-03-05 DIAGNOSIS — I10 Essential (primary) hypertension: Secondary | ICD-10-CM | POA: Diagnosis not present

## 2024-03-05 DIAGNOSIS — D509 Iron deficiency anemia, unspecified: Secondary | ICD-10-CM | POA: Insufficient documentation

## 2024-03-05 DIAGNOSIS — Z0000 Encounter for general adult medical examination without abnormal findings: Secondary | ICD-10-CM

## 2024-03-05 DIAGNOSIS — Z131 Encounter for screening for diabetes mellitus: Secondary | ICD-10-CM | POA: Diagnosis not present

## 2024-03-05 DIAGNOSIS — N809 Endometriosis, unspecified: Secondary | ICD-10-CM

## 2024-03-05 DIAGNOSIS — E785 Hyperlipidemia, unspecified: Secondary | ICD-10-CM | POA: Insufficient documentation

## 2024-03-05 LAB — BASIC METABOLIC PANEL WITH GFR
BUN: 11 mg/dL (ref 6–23)
CO2: 23 meq/L (ref 19–32)
Calcium: 8.8 mg/dL (ref 8.4–10.5)
Chloride: 109 meq/L (ref 96–112)
Creatinine, Ser: 0.91 mg/dL (ref 0.40–1.20)
GFR: 75.17 mL/min (ref >60.00)
Glucose, Bld: 87 mg/dL (ref 70–99)
Potassium: 4 meq/L (ref 3.5–5.1)
Sodium: 142 meq/L (ref 135–145)

## 2024-03-05 LAB — CBC
HCT: 35.6 % — ABNORMAL LOW (ref 36.0–46.0)
Hemoglobin: 11.9 g/dL — ABNORMAL LOW (ref 12.0–15.0)
MCHC: 33.3 g/dL (ref 30.0–36.0)
MCV: 99.4 fl (ref 78.0–100.0)
Platelets: 257 10*3/uL (ref 150.0–400.0)
RBC: 3.58 Mil/uL — ABNORMAL LOW (ref 3.87–5.11)
RDW: 13.3 % (ref 11.5–15.5)
WBC: 4.2 10*3/uL (ref 4.0–10.5)

## 2024-03-05 LAB — LIPID PANEL
Cholesterol: 186 mg/dL (ref 0–200)
HDL: 66.4 mg/dL (ref >39.00)
LDL Cholesterol: 106 mg/dL — ABNORMAL HIGH (ref 0–99)
NonHDL: 119.98
Total CHOL/HDL Ratio: 3
Triglycerides: 68 mg/dL (ref 0.0–149.0)
VLDL: 13.6 mg/dL (ref 0.0–40.0)

## 2024-03-05 LAB — IBC + FERRITIN
Ferritin: 25 ng/mL (ref 10.0–291.0)
Iron: 99 ug/dL (ref 42–145)
Saturation Ratios: 22.7 % (ref 20.0–50.0)
TIBC: 435.4 ug/dL (ref 250.0–450.0)
Transferrin: 311 mg/dL (ref 212.0–360.0)

## 2024-03-05 LAB — HEMOGLOBIN A1C: Hgb A1c MFr Bld: 5.1 % (ref 4.6–6.5)

## 2024-03-05 MED ORDER — CHLORTHALIDONE 25 MG PO TABS: 25.0000 mg | ORAL_TABLET | Freq: Every day | ORAL | 1 refills | Status: DC

## 2024-03-05 MED ORDER — PRAVASTATIN SODIUM 20 MG PO TABS: 20.0000 mg | ORAL_TABLET | Freq: Every evening | ORAL | 1 refills | Status: DC

## 2024-03-05 NOTE — Assessment & Plan Note (Addendum)
 Very healthy person.  We talked about alcohol use.  We talked about exercise.  We are going to monitor her mood closely.  For age-appropriate cancer screening, she has annual breast cancer screening through her gynecologist office.  We talked about colon cancer screening, we decided to defer this until after the age of 42. Patient declined STI testing today.

## 2024-03-05 NOTE — Assessment & Plan Note (Signed)
 Chronic difficult issue.  She continues to have abnormal uterine bleeding that was affecting her life.  Not currently on any medical treatment.  This is being managed with gynecology, but it is not clear that surgery would be effective for her.

## 2024-03-05 NOTE — Assessment & Plan Note (Signed)
 Chronic, currently above goal.  Patient has stage I hypertension.  Likely to only need 1 agent.  Has been on a thiazide medication for many years and doing well.  I we will change hydrochlorothiazide to chlorthalidone 25 mg daily. Check labs today.  Follow-up in 6 months.

## 2024-03-05 NOTE — Assessment & Plan Note (Signed)
 History of iron deficiency due to chronic abnormal uterine bleeding which is still an issue for her.  She is at risk for iron deficiency again, will check CBC and iron levels.

## 2024-03-05 NOTE — Progress Notes (Addendum)
 New Patient Office Visit  Subjective    Patient ID: Jenna Heath, female    DOB: April 19, 1976  Age: 48 y.o. MRN: [id number]  CC:  Chief Complaint  Patient presents with   Establish Care    Would like to have medication refilled.     HPI  Jenna Heath presents to establish care  49 year old person here for management of hypertension.  She had a primary care physician at [facility] for a number of years.  That physician left the office and she is looking for a new primary care.  Patient is originally from [place] .  Moved to [place] about 5 years ago.  She lives with her 78 year old son.  She works in [employer] in [place], a high pressure job.  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  She reports that her mood is stable.  Denies anxiety or depression.  Sometimes has trouble sleeping because of worries of all the tasks that she has to complete.  She has enjoyed good health.  Her biggest chronic issue has been abnormal uterine bleeding for many years.  This has been sporadic and intermittent.  She has endometriosis and was having significant discomfort with each menstrual cycle.  She reports having prior attempted surgery for hysterectomy, but this was unable to be performed because of the extent of her endometriosis.  She has tried medical management of her chronic bleeding with Megace , Lupron , but these were only partially effective.  Currently she is on no medications.  She had discussed the neck step some surgery with her gynecologist, but she was discouraged because it had a relatively low success rate.  Denies any recent illnesses.  No chest pain or shortness of breath.  Does not exercise regularly but is hopeful to get into this soon.  No recent hospitalizations.  She has no tobacco use.  She does drink alcohol on  a regular basis, but not daily.  Sometimes she uses more alcohol than intended to help with sleep and stress.  She is not currently concerned about her alcohol use.    Outpatient Encounter Medications as of 03/05/2024  Medication Sig   chlorthalidone (HYGROTON) 25 MG tablet Take 1 tablet (25 mg total) by mouth daily.   [DISCONTINUED] hydrochlorothiazide (MICROZIDE) 12.5 MG capsule Take 12.5 mg by mouth daily at 12 noon.   [DISCONTINUED] ibuprofen (ADVIL) 200 MG tablet Take 400 mg by mouth every 8 (eight) hours as needed (pain.).   [DISCONTINUED] megestrol  (MEGACE ) 40 MG tablet Take 1 tablet (40 mg total) by mouth 2 (two) times daily. Can increase to two tablets twice a day in the event of heavy bleeding   [DISCONTINUED] metroNIDAZOLE  (METROGEL ) 0.75 % vaginal gel Place 1 Applicatorful vaginally at bedtime. Apply one applicatorful to vagina at bedtime for 5 days   [DISCONTINUED] pravastatin (PRAVACHOL) 20 MG tablet Take 20 mg by mouth every evening.   pravastatin (PRAVACHOL) 20 MG tablet Take 1 tablet (20 mg total) by mouth every evening.   [DISCONTINUED] leuprolide  (LUPRON  DEPOT, 87-MONTH,) 11.25 MG injection Inject 11.25 mg into the muscle every 3 (three) months. (Patient not taking: Reported on 10/31/2023)   [DISCONTINUED] naproxen  (NAPROSYN ) 500 MG tablet Take 1 tablet (500 mg total) by mouth 2 (two) times daily as needed for moderate pain. (Patient not taking: Reported on 10/31/2023)   [DISCONTINUED]  ondansetron  (ZOFRAN ) 4 MG tablet Take 1 tablet (4 mg total) by mouth every 8 (eight) hours as needed for up to 15 doses for nausea or vomiting. (Patient not taking: Reported on 10/31/2023)   [DISCONTINUED] traMADol  (ULTRAM ) 50 MG tablet Take 1 tablet (50 mg total) by mouth every 12 (twelve) hours as needed for up to 60 doses. (Patient not taking: Reported on 10/31/2023)   No facility-administered encounter medications on file as of 03/05/2024.    Past Medical History:  Diagnosis Date   Hypertension      Past Surgical History:  Procedure Laterality Date   CESAREAN SECTION     ENDOMETRIAL ABLATION  2011   LAPAROSCOPIC APPENDECTOMY N/A 02/16/2022   Procedure: Diagnostic Laparoscopy with Appendectomy and Umbilical Hernia Repair;  Surgeon: Lujean Sake, MD;  Location: [facility] OR;  Service: General;  Laterality: N/A;    Family History  Problem Relation Age of Onset   Prostate cancer Maternal Grandfather    Liver disease Mother    Liver cancer Maternal Aunt    Breast cancer Neg Hx          Objective    BP (!) 137/98   Pulse (!) 108   Wt 219 lb (99.3 kg)   SpO2 98%   BMI 33.30 kg/m   Physical Exam  Gen: Well-appearing woman Eyes: Normal Ears: Normal bilateral tympanic membranes Neck: Normal thyroid, no adenopathy or nodules Heart: Regular, no murmur Lungs: Unlabored, clear throughout Abd: Soft, mild tenderness to palpation in the lower quadrants, no organomegaly Ext: Warm, well-perfused, no edema, normal joints Neuro: Alert, conversational, full strength upper and lower extremities, normal gait Psych: Appropriate mood and affect, not depressed or anxious.,  Pleasant to talk with      Assessment & Plan:   Problem List Items Addressed This Visit       High   Endometriosis (Chronic)   Chronic difficult issue.  She continues to have abnormal uterine bleeding that was affecting her life.  Not currently on any medical treatment.  This is being managed with gynecology, but it is not clear that surgery would be effective for her.      Hypertension - Primary (Chronic)   Chronic, currently above goal.  Patient has stage I hypertension.  Likely to only need 1 agent.  Has been on a thiazide medication for many years and doing well.  I we will change hydrochlorothiazide to chlorthalidone 25 mg daily. Check labs today.  Follow-up in 6 months.      Relevant Medications   chlorthalidone (HYGROTON) 25 MG tablet   pravastatin (PRAVACHOL) 20 MG tablet   Other Relevant Orders    Basic metabolic panel with GFR   Hyperlipidemia (Chronic)   Chronic.  Has been on cholesterol medications for many years.  Will check lipids today.  Continue with pravastatin for now.      Relevant Medications   chlorthalidone (HYGROTON) 25 MG tablet   pravastatin (PRAVACHOL) 20 MG tablet   Other Relevant Orders   Lipid panel     Low   Iron deficiency anemia (Chronic)   History of iron deficiency due to chronic abnormal uterine bleeding which is still an issue for her.  She is at risk for iron deficiency again, will check CBC and iron levels.      Relevant Orders   CBC   IBC + Ferritin   Health care maintenance (Chronic)   Very healthy person.  We talked about alcohol use.  We talked about exercise.  We are going to monitor her mood closely.  For age-appropriate cancer screening, she has annual breast cancer screening through her gynecologist office.  We talked about colon cancer screening, we decided to defer this until after the age of 68. Patient declined STI testing today.      Other Visit Diagnoses       Screening for diabetes mellitus       Relevant Orders   Hemoglobin A1c       Return in about 6 months (around 09/05/2024) for hypertension management.   Ether Hercules, MD

## 2024-03-05 NOTE — Assessment & Plan Note (Signed)
 Chronic.  Has been on cholesterol medications for many years.  Will check lipids today.  Continue with pravastatin for now.

## 2024-03-06 ENCOUNTER — Ambulatory Visit: Payer: Self-pay | Admitting: Student in an Organized Health Care Education/Training Program

## 2024-08-03 ENCOUNTER — Other Ambulatory Visit: Payer: Self-pay | Admitting: Student in an Organized Health Care Education/Training Program

## 2024-08-03 DIAGNOSIS — I10 Essential (primary) hypertension: Secondary | ICD-10-CM

## 2024-08-03 DIAGNOSIS — E7849 Other hyperlipidemia: Secondary | ICD-10-CM

## 2024-09-01 ENCOUNTER — Other Ambulatory Visit: Payer: Self-pay | Admitting: Student in an Organized Health Care Education/Training Program

## 2024-09-01 DIAGNOSIS — E7849 Other hyperlipidemia: Secondary | ICD-10-CM

## 2024-09-07 ENCOUNTER — Encounter: Payer: Self-pay | Admitting: Student in an Organized Health Care Education/Training Program

## 2024-09-07 ENCOUNTER — Ambulatory Visit: Admitting: Student in an Organized Health Care Education/Training Program

## 2024-09-07 VITALS — BP 115/80 | HR 88 | Wt 212.0 lb

## 2024-09-07 DIAGNOSIS — Z0000 Encounter for general adult medical examination without abnormal findings: Secondary | ICD-10-CM | POA: Diagnosis not present

## 2024-09-07 DIAGNOSIS — E7849 Other hyperlipidemia: Secondary | ICD-10-CM

## 2024-09-07 DIAGNOSIS — N809 Endometriosis, unspecified: Secondary | ICD-10-CM

## 2024-09-07 DIAGNOSIS — I10 Essential (primary) hypertension: Secondary | ICD-10-CM

## 2024-09-07 DIAGNOSIS — L659 Nonscarring hair loss, unspecified: Secondary | ICD-10-CM | POA: Insufficient documentation

## 2024-09-07 DIAGNOSIS — Z1231 Encounter for screening mammogram for malignant neoplasm of breast: Secondary | ICD-10-CM

## 2024-09-07 MED ORDER — PRAVASTATIN SODIUM 20 MG PO TABS: 20.0000 mg | ORAL_TABLET | Freq: Every day | ORAL | 3 refills | Status: AC

## 2024-09-07 MED ORDER — CHLORTHALIDONE 25 MG PO TABS: 25.0000 mg | ORAL_TABLET | Freq: Every day | ORAL | 3 refills | Status: AC

## 2024-09-07 NOTE — Assessment & Plan Note (Signed)
 Her blood pressure is well-controlled with the current medication, and she reports no side effects. Continue the current antihypertensive regimen. A prescription for a 90-day supply with three refills was sent.

## 2024-09-07 NOTE — Assessment & Plan Note (Signed)
 She is on pravastatin , and previous refill issues have been resolved. A prescription for a 90-day supply with three refills was sent.

## 2024-09-07 NOTE — Patient Instructions (Signed)
  VISIT SUMMARY: During your routine follow-up visit, we discussed your well-controlled blood pressure, weight loss, improved mood, and concerns about hair thinning. We also addressed your issues with cholesterol medication refills and menstrual irregularities.  YOUR PLAN: -ESSENTIAL HYPERTENSION: Your blood pressure is well-controlled with your current medication, and you are not experiencing any side effects. Hypertension means high blood pressure, which can lead to serious health problems if not managed. Continue taking your current medication as prescribed. A prescription for a 90-day supply with three refills has been sent.  -OTHER HYPERLIPIDEMIA: Hyperlipidemia means having high levels of fats (lipids) in your blood, which can increase your risk of heart disease. You are on pravastatin  for this condition, and the previous issues with refilling your prescription have been resolved. A prescription for a 90-day supply with three refills has been sent.  -MENSTRUAL IRREGULARITY WITH HISTORY OF DISABLING PAIN, ON LEVONORGESTREL  IUD: Your menstrual pain has reduced since starting the levonorgestrel  IUD, although your periods are still irregular. This condition involves changes in your menstrual cycle, which can be managed with the IUD. Continue using the levonorgestrel  IUD.  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 Traction alopecia is hair loss caused by tension on the hair. You were advised to avoid tight braids and consider using wigs and hair growth treatments.  INSTRUCTIONS: A mammogram has been ordered at the imaging center. Please schedule and complete this test. Follow-up in six months for a physical exam and blood work.

## 2024-09-07 NOTE — Assessment & Plan Note (Signed)
 Pain has reduced since starting the levonorgestrel  IUD. Periods are less painful but still occur. Continue the levonorgestrel  IUD.

## 2024-09-07 NOTE — Assessment & Plan Note (Signed)
 During her routine wellness visit, her blood pressure is well-controlled, she has lost weight, and her mood has improved. She declined the flu vaccine and colon cancer screening. A mammogram was ordered at the imaging center. A follow-up is scheduled in six months for a physical exam and blood work.  She is up-to-date on cervical cancer screening.  No issues with substance use or mood disorder.

## 2024-09-07 NOTE — Progress Notes (Signed)
 Complete physical exam  Patient: Jenna Heath    DOB: 08/14/1976 48 y.o.   MRN: [id number]  Chief Complaint  Patient presents with   Hypertension    6 month follow up     Subjective:    Jenna Heath is a 48 y.o. female who presents today for a complete physical exam. She reports consuming a general diet.  She generally feels well. She reports sleeping well. She does not have additional problems to discuss today.   Discussed the use of AI scribe software for clinical note transcription with the patient, who gave verbal consent to proceed.  History of Present Illness Jenna Heath is a 48 year old female with hypertension who presents for a routine follow-up visit.  She reports that her blood pressure is better than it has been in a long time, and that her blood pressure was recently measured as lower than usual. She notes that her blood pressure medication was changed at her last visit. She is not currently monitoring her blood pressure at home, although she owns a blood pressure machine. No lightheadedness or dizziness.  She has been unable to refill her cholesterol medication, pravastatin , due to a lack of prescription renewal.  Her menstrual periods have been irregular since the insertion of a Mirena  IUD, with variability in bleeding. Some periods are severe, while others are less so. She notes a significant reduction in pain compared to before the IUD insertion, describing the previous pain as disabling and requiring emergency room visits.  She reports that she has lost weight since her last visit. She attributes this weight loss to increased physical activity due to a new role at work that involves more walking, as well as improved dietary habits, although she admits to recent junk food cravings.  Her mood has improved since changing jobs in July, which has reduced her stress levels significantly. No issues with down or anxious mood.  She is concerned about hair  thinning at the front of her scalp, which she attributes to frequent tight braiding. She plans to avoid braids for a while and try hair growth products.   Most recent fall risk assessment:    03/05/2024    8:56 AM  Fall Risk   Falls in the past year? 0  Number falls in past yr: 0  Injury with Fall? 0  Risk for fall due to : No Fall Risks  Follow up Falls evaluation completed     Most recent depression screenings:    03/05/2024    8:56 AM 10/31/2023    4:06 PM  PHQ 2/9 Scores  PHQ - 2 Score 0 0  PHQ- 9 Score 3  2      Data saved with a previous flowsheet row definition    Patient Care Team: Jerrell Cleatus Ned, MD as PCP - General (Internal Medicine)      Objective:    BP 115/80   Pulse 88   Wt 212 lb (96.2 kg)   SpO2 100%   BMI 32.23 kg/m   Physical Exam   Gen: Well-appearing woman Neck: Normal thyroid, no nodules or adenopathy Scalp: Mild hair loss at the crown, negative pull test, normal scalp with no scarring or erythema, no bogginess Heart: Regular, no murmur Lungs: Unlabored, clear throughout Abd: Soft, nontender Ext: Warm, no edema Neuro: Alert, conversational, normal get up and go, normal gait and balance, full strength upper and lower extremities      Assessment & Plan:    Routine Health  Maintenance and Physical Exam  There is no immunization history on file for this patient.  Health Maintenance  Topic Date Due   HIV Screening  Never done   Hepatitis C Screening  Never done   DTaP/Tdap/Td (1 - Tdap) Never done   Hepatitis B Vaccines 19-59 Average Risk (1 of 3 - 19+ 3-dose series) Never done   Colonoscopy  Never done   Mammogram  11/09/2023   Influenza Vaccine  Never done   COVID-19 Vaccine (1 - 2025-26 season) Never done   Cervical Cancer Screening (HPV/Pap Cotest)  12/29/2026   Pneumococcal Vaccine  Aged Out   HPV VACCINES  Aged Out   Meningococcal B Vaccine  Aged Out    Discussed health benefits of physical activity, and encouraged  her to engage in regular exercise appropriate for her age and condition.  Problem List Items Addressed This Visit       High   Endometriosis (Chronic)   Pain has reduced since starting the levonorgestrel  IUD. Periods are less painful but still occur. Continue the levonorgestrel  IUD.      Hypertension (Chronic)   Her blood pressure is well-controlled with the current medication, and she reports no side effects. Continue the current antihypertensive regimen. A prescription for a 90-day supply with three refills was sent.      Relevant Medications   pravastatin  (PRAVACHOL ) 20 MG tablet   chlorthalidone  (HYGROTON ) 25 MG tablet   Hyperlipidemia (Chronic)   She is on pravastatin , and previous refill issues have been resolved. A prescription for a 90-day supply with three refills was sent.      Relevant Medications   pravastatin  (PRAVACHOL ) 20 MG tablet   chlorthalidone  (HYGROTON ) 25 MG tablet     Low   Health care maintenance - Primary (Chronic)   During her routine wellness visit, her blood pressure is well-controlled, she has lost weight, and her mood has improved. She declined the flu vaccine and colon cancer screening. A mammogram was ordered at the imaging center. A follow-up is scheduled in six months for a physical exam and blood work.  She is up-to-date on cervical cancer screening.  No issues with substance use or mood disorder.      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 She was advised to avoid tight braids.       Other Visit Diagnoses       Screening mammogram for breast cancer       Relevant Orders   MM 3D SCREENING MAMMOGRAM BILATERAL BREAST       Return in about 6 months (around 03/08/2025).    Cleatus Debby Specking, MD [facility] [place]

## 2024-09-07 NOTE — Assessment & Plan Note (Addendum)
 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 She was advised to avoid tight braids.

## 2024-10-02 ENCOUNTER — Ambulatory Visit
